# Patient Record
Sex: Male | Born: 1960 | Race: White | Hispanic: No | Marital: Married | Smoking: Former smoker
Health system: Southern US, Community
[De-identification: ages and names within clinical notes are randomized; demographics above are authoritative.]

## PROBLEM LIST (undated history)

## (undated) DIAGNOSIS — F32A Depression, unspecified: Secondary | ICD-10-CM

## (undated) DIAGNOSIS — Z87891 Personal history of nicotine dependence: Principal | ICD-10-CM

## (undated) DIAGNOSIS — F329 Major depressive disorder, single episode, unspecified: Secondary | ICD-10-CM

## (undated) DIAGNOSIS — M479 Spondylosis, unspecified: Secondary | ICD-10-CM

## (undated) HISTORY — DX: Personal history of nicotine dependence: Z87.891

## (undated) HISTORY — PX: COLONOSCOPY: SHX174

## (undated) HISTORY — PX: OTHER SURGICAL HISTORY: SHX169

---

## 2011-03-26 ENCOUNTER — Ambulatory Visit: Payer: Self-pay | Admitting: Internal Medicine

## 2011-03-26 LAB — COMPREHENSIVE METABOLIC PANEL
Anion Gap: 9 (ref 7–16)
Calcium, Total: 8.8 mg/dL (ref 8.5–10.1)
Chloride: 106 mmol/L (ref 98–107)
Co2: 27 mmol/L (ref 21–32)
EGFR (African American): >60
EGFR (Non-African Amer.): >60
Potassium: 4.1 mmol/L (ref 3.5–5.1)
SGOT(AST): 21 U/L (ref 15–37)

## 2011-03-26 LAB — HDL CHOLESTEROL: HDL Cholesterol: 38 mg/dL — ABNORMAL LOW (ref 40–60)

## 2011-08-29 ENCOUNTER — Ambulatory Visit: Payer: Self-pay | Admitting: Specialist

## 2012-08-20 ENCOUNTER — Ambulatory Visit: Payer: Self-pay | Admitting: Gastroenterology

## 2012-08-23 LAB — PATHOLOGY REPORT

## 2015-06-01 ENCOUNTER — Telehealth: Payer: Self-pay | Admitting: *Deleted

## 2015-06-01 NOTE — Telephone Encounter (Signed)
Left voicemail offering lung cancer screening appt Tuesday am 06/05/15. Will follow up.

## 2015-06-05 ENCOUNTER — Other Ambulatory Visit: Payer: Self-pay | Admitting: Family Medicine

## 2015-06-05 ENCOUNTER — Inpatient Hospital Stay: Payer: Commercial Managed Care - PPO | Attending: Family Medicine | Admitting: Family Medicine

## 2015-06-05 ENCOUNTER — Ambulatory Visit
Admission: RE | Admit: 2015-06-05 | Discharge: 2015-06-05 | Disposition: A | Discharge status: Home / Self Care | Payer: Commercial Managed Care - PPO | Source: Ambulatory Visit | Attending: Family Medicine | Admitting: Family Medicine

## 2015-06-05 ENCOUNTER — Encounter: Payer: Self-pay | Admitting: Family Medicine

## 2015-06-05 DIAGNOSIS — Z122 Encounter for screening for malignant neoplasm of respiratory organs: Secondary | ICD-10-CM

## 2015-06-05 DIAGNOSIS — Z87891 Personal history of nicotine dependence: Secondary | ICD-10-CM

## 2015-06-05 DIAGNOSIS — F17210 Nicotine dependence, cigarettes, uncomplicated: Secondary | ICD-10-CM | POA: Diagnosis not present

## 2015-06-05 HISTORY — DX: Personal history of nicotine dependence: Z87.891

## 2015-06-05 NOTE — Progress Notes (Signed)
In accordance with CMS guidelines, patient has meet eligibility criteria including age, absence of signs or symptoms of lung cancer, the specific calculation of cigarette smoking pack-years was 35 years and is a current smoker.   A shared decision-making session was conducted prior to the performance of CT scan. This includes one or more decision aids, includes benefits and harms of screening, follow-up diagnostic testing, over-diagnosis, false positive rate, and total radiation exposure.  Counseling on the importance of adherence to annual lung cancer LDCT screening, impact of co-morbidities, and ability or willingness to undergo diagnosis and treatment is imperative for compliance of the program.  Counseling on the importance of continued smoking cessation for former smokers; the importance of smoking cessation for current smokers and information about tobacco cessation interventions have been given to patient including the [place] at [facility], [address] quit [place], as well as Cancer Center specific smoking cessation programs.  Written order for lung cancer screening with LDCT has been given to the patient and any and all questions have been answered to the best of my abilities.   Yearly follow up will be scheduled by Shawn Perkins, Thoracic Navigator.   

## 2015-06-08 ENCOUNTER — Telehealth: Payer: Self-pay | Admitting: *Deleted

## 2015-06-08 NOTE — Telephone Encounter (Signed)
  Oncology Nurse Navigator Documentation    Navigator Encounter Type: Telephone;Screening (06/08/15 1400)                          Notified patient of LDCT lung cancer screening results of Lung Rads 1 finding with recommendation for 12 month follow up imaging. Patient verbalizes understanding.   IMPRESSION: 1. Lung-RADS Category 1, negative. Continue annual screening with low-dose chest CT without contrast in 12 months.

## 2016-01-18 ENCOUNTER — Emergency Department: Payer: Commercial Managed Care - PPO

## 2016-01-18 ENCOUNTER — Emergency Department
Admission: EM | Admit: 2016-01-18 | Discharge: 2016-01-18 | Disposition: A | Discharge status: Home / Self Care | Payer: Commercial Managed Care - PPO | Attending: Emergency Medicine | Admitting: Emergency Medicine

## 2016-01-18 ENCOUNTER — Encounter: Payer: Self-pay | Admitting: Emergency Medicine

## 2016-01-18 DIAGNOSIS — S60511A Abrasion of right hand, initial encounter: Secondary | ICD-10-CM | POA: Diagnosis not present

## 2016-01-18 DIAGNOSIS — F17210 Nicotine dependence, cigarettes, uncomplicated: Secondary | ICD-10-CM | POA: Insufficient documentation

## 2016-01-18 DIAGNOSIS — Y999 Unspecified external cause status: Secondary | ICD-10-CM | POA: Insufficient documentation

## 2016-01-18 DIAGNOSIS — Y939 Activity, unspecified: Secondary | ICD-10-CM | POA: Diagnosis not present

## 2016-01-18 DIAGNOSIS — S60211A Contusion of right wrist, initial encounter: Secondary | ICD-10-CM

## 2016-01-18 DIAGNOSIS — Y929 Unspecified place or not applicable: Secondary | ICD-10-CM | POA: Insufficient documentation

## 2016-01-18 DIAGNOSIS — S8001XA Contusion of right knee, initial encounter: Secondary | ICD-10-CM | POA: Diagnosis not present

## 2016-01-18 DIAGNOSIS — S0993XA Unspecified injury of face, initial encounter: Secondary | ICD-10-CM | POA: Diagnosis present

## 2016-01-18 DIAGNOSIS — S8002XA Contusion of left knee, initial encounter: Secondary | ICD-10-CM | POA: Diagnosis not present

## 2016-01-18 DIAGNOSIS — R51 Headache: Secondary | ICD-10-CM | POA: Insufficient documentation

## 2016-01-18 DIAGNOSIS — S0083XA Contusion of other part of head, initial encounter: Secondary | ICD-10-CM | POA: Insufficient documentation

## 2016-01-18 MED ORDER — METHOCARBAMOL 500 MG PO TABS: 500.0000 mg | ORAL_TABLET | Freq: Four times a day (QID) | ORAL | Status: DC

## 2016-01-18 MED ORDER — MELOXICAM 15 MG PO TABS: 15.0000 mg | ORAL_TABLET | Freq: Every day | ORAL | Status: DC
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
## 2016-05-15 ENCOUNTER — Telehealth: Payer: Self-pay | Admitting: *Deleted

## 2016-05-15 NOTE — Telephone Encounter (Signed)
Notified patient that annual lung cancer screening low dose CT scan is due. Confirmed that patient is within the age range of 55-77, and asymptomatic, (no signs or symptoms of lung cancer). Patient denies illness that would prevent curative treatment for lung cancer if found. The patient is a current smoker, with a 36 pack year history. The shared decision making visit was done 06/05/15. Patient is agreeable for CT scan being scheduled.

## 2016-05-28 ENCOUNTER — Other Ambulatory Visit: Payer: Self-pay | Admitting: *Deleted

## 2016-05-28 DIAGNOSIS — Z87891 Personal history of nicotine dependence: Secondary | ICD-10-CM

## 2016-06-04 ENCOUNTER — Ambulatory Visit
Admission: RE | Admit: 2016-06-04 | Discharge: 2016-06-04 | Disposition: A | Discharge status: Home / Self Care | Payer: Commercial Managed Care - PPO | Source: Ambulatory Visit | Attending: Oncology | Admitting: Oncology

## 2016-06-04 DIAGNOSIS — Z87891 Personal history of nicotine dependence: Secondary | ICD-10-CM | POA: Diagnosis not present

## 2016-06-19 ENCOUNTER — Telehealth: Payer: Self-pay | Admitting: *Deleted

## 2016-06-19 NOTE — Telephone Encounter (Signed)
Notified patient of LDCT lung cancer screening results with recommendation for 12 month follow up imaging. Patient verbalizes understanding. Copy of this note will be sent to PCP via Epic.   IMPRESSION: Lung-RADS Category 1, negative. Continue annual screening with low-dose chest CT without contrast in 12 months.

## 2017-05-26 ENCOUNTER — Telehealth: Payer: Self-pay | Admitting: *Deleted

## 2017-05-26 DIAGNOSIS — Z122 Encounter for screening for malignant neoplasm of respiratory organs: Secondary | ICD-10-CM

## 2017-05-26 DIAGNOSIS — Z87891 Personal history of nicotine dependence: Secondary | ICD-10-CM

## 2017-05-26 NOTE — Telephone Encounter (Signed)
Notified patient that annual lung cancer screening low dose CT scan is due currently or will be in near future. Confirmed that patient is within the age range of 55-77, and asymptomatic, (no signs or symptoms of lung cancer). Patient denies illness that would prevent curative treatment for lung cancer if found. Verified smoking history, (current, 37 pack year). The shared decision making visit was done 06/05/15. Patient is agreeable for CT scan being scheduled.

## 2017-06-05 ENCOUNTER — Ambulatory Visit
Admission: RE | Admit: 2017-06-05 | Discharge: 2017-06-05 | Disposition: A | Discharge status: Home / Self Care | Payer: Commercial Managed Care - PPO | Source: Ambulatory Visit | Attending: Oncology | Admitting: Oncology

## 2017-06-05 DIAGNOSIS — Z122 Encounter for screening for malignant neoplasm of respiratory organs: Secondary | ICD-10-CM | POA: Insufficient documentation

## 2017-06-05 DIAGNOSIS — Z87891 Personal history of nicotine dependence: Secondary | ICD-10-CM | POA: Diagnosis present

## 2017-06-05 DIAGNOSIS — J439 Emphysema, unspecified: Secondary | ICD-10-CM | POA: Insufficient documentation

## 2017-06-08 ENCOUNTER — Encounter: Payer: Self-pay | Admitting: *Deleted

## 2018-03-18 ENCOUNTER — Other Ambulatory Visit: Payer: Self-pay | Admitting: Orthopedic Surgery

## 2018-04-02 ENCOUNTER — Encounter (HOSPITAL_COMMUNITY): Payer: Self-pay

## 2018-04-02 NOTE — Patient Instructions (Signed)
Your procedure is scheduled on: Monday, Aug. 5, 2019   Surgery Time:  12noon-1:45PM   [facility]    Report to admitting at 9:30 AM   Call this number if you have problems the morning of surgery [phone]   Do not eat food or drink liquids :After Midnight.   Do NOT smoke after Midnight   Take these medicines the morning of surgery with A SIP OF WATER: Bupropion                               You may not have any metal on your body including  jewelry, and body piercings             Do not wear lotions, powders, perfumes/cologne, or deodorant                           Men may shave face and neck.   Do not bring valuables to the hospital. [place] IS NOT             RESPONSIBLE   FOR VALUABLES.   Contacts, dentures or bridgework may not be worn into surgery.   Leave suitcase in the car. After surgery it may be brought to your room.   Special Instructions: Bring a copy of your healthcare power of attorney and living will documents         the day of surgery if you haven't scanned them in before.              Please read over the following fact sheets you were given:  [facility] - Preparing for Surgery Before surgery, you can play an important role.  Because skin is not sterile, your skin needs to be as free of germs as possible.  You can reduce the number of germs on your skin by washing with CHG (chlorahexidine gluconate) soap before surgery.  CHG is an antiseptic cleaner which kills germs and bonds with the skin to continue killing germs even after washing. Please DO NOT use if you have an allergy to CHG or antibacterial soaps.  If your skin becomes reddened/irritated stop using the CHG and inform your nurse when you arrive at Short Stay. Do not shave (including legs and underarms) for at least 48 hours prior to the first CHG shower.  You may shave your face/neck.  Please follow these instructions carefully:  1.  Shower with CHG Soap  the night before surgery and the  morning of surgery.  2.  If you choose to wash your hair, wash your hair first as usual with your normal  shampoo.  3.  After you shampoo, rinse your hair and body thoroughly to remove the shampoo.                             4.  Use CHG as you would any other liquid soap.  You can apply chg directly to the skin and wash.  Gently with a scrungie or clean washcloth.  5.  Apply the CHG Soap to your body ONLY FROM THE NECK DOWN.   Do   not use on face/ open                           Wound or  open sores. Avoid contact with eyes, ears mouth and   genitals (private parts).                       Wash face,  Genitals (private parts) with your normal soap.             6.  Wash thoroughly, paying special attention to the area where your    surgery  will be performed.  7.  Thoroughly rinse your body with warm water from the neck down.  8.  DO NOT shower/wash with your normal soap after using and rinsing off the CHG Soap.                9.  Pat yourself dry with a clean towel.            10.  Wear clean pajamas.            11.  Place clean sheets on your bed the night of your first shower and do not  sleep with pets. Day of Surgery : Do not apply any lotions/deodorants the morning of surgery.  Please wear clean clothes to the hospital/surgery center.  FAILURE TO FOLLOW THESE INSTRUCTIONS MAY RESULT IN THE CANCELLATION OF YOUR SURGERY  PATIENT SIGNATURE_________________________________  NURSE SIGNATURE__________________________________  ________________________________________________________________________   Anthony Shelton  An incentive spirometer is a tool that can help keep your lungs clear and active. This tool measures how well you are filling your lungs with each breath. Taking long deep breaths may help reverse or decrease the chance of developing breathing (pulmonary) problems (especially infection) following:  A long period of time when you are unable  to move or be active. BEFORE THE PROCEDURE   If the spirometer includes an indicator to show your best effort, your nurse or respiratory therapist will set it to a desired goal.  If possible, sit up straight or lean slightly forward. Try not to slouch.  Hold the incentive spirometer in an upright position. INSTRUCTIONS FOR USE  1. Sit on the edge of your bed if possible, or sit up as far as you can in bed or on a chair. 2. Hold the incentive spirometer in an upright position. 3. Breathe out normally. 4. Place the mouthpiece in your mouth and seal your lips tightly around it. 5. Breathe in slowly and as deeply as possible, raising the piston or the ball toward the top of the column. 6. Hold your breath for 3-5 seconds or for as long as possible. Allow the piston or ball to fall to the bottom of the column. 7. Remove the mouthpiece from your mouth and breathe out normally. 8. Rest for a few seconds and repeat Steps 1 through 7 at least 10 times every 1-2 hours when you are awake. Take your time and take a few normal breaths between deep breaths. 9. The spirometer may include an indicator to show your best effort. Use the indicator as a goal to work toward during each repetition. 10. After each set of 10 deep breaths, practice coughing to be sure your lungs are clear. If you have an incision (the cut made at the time of surgery), support your incision when coughing by placing a pillow or rolled up towels firmly against it. Once you are able to get out of bed, walk around indoors and cough well. You may stop using the incentive spirometer when instructed by your caregiver.  RISKS AND COMPLICATIONS  Take your time so you  do not get dizzy or light-headed.  If you are in pain, you may need to take or ask for pain medication before doing incentive spirometry. It is harder to take a deep breath if you are having pain. AFTER USE  Rest and breathe slowly and easily.  It can be helpful to keep track  of a log of your progress. Your caregiver can provide you with a simple table to help with this. If you are using the spirometer at home, follow these instructions: Page IF:   You are having difficultly using the spirometer.  You have trouble using the spirometer as often as instructed.  Your pain medication is not giving enough relief while using the spirometer.  You develop fever of 100.5 F (38.1 C) or higher. SEEK IMMEDIATE MEDICAL CARE IF:   You cough up bloody sputum that had not been present before.  You develop fever of 102 F (38.9 C) or greater.  You develop worsening pain at or near the incision site. MAKE SURE YOU:   Understand these instructions.  Will watch your condition.  Will get help right away if you are not doing well or get worse. Document Released: 01/05/2007 Document Revised: 11/17/2011 Document Reviewed: 03/08/2007 ExitCare Patient Information 2014 Exit[employer], [place].   ________________________________________________________________________  WHAT IS A BLOOD TRANSFUSION? Blood Transfusion Information  A transfusion is the replacement of blood or some of its parts. Blood is made up of multiple cells which provide different functions.  Red blood cells carry oxygen and are used for blood loss replacement.  White blood cells fight against infection.  Platelets control bleeding.  Plasma helps clot blood.  Other blood products are available for specialized needs, such as hemophilia or other clotting disorders. BEFORE THE TRANSFUSION  Who gives blood for transfusions?   Healthy volunteers who are fully evaluated to make sure their blood is safe. This is blood bank blood. Transfusion therapy is the safest it has ever been in the practice of medicine. Before blood is taken from a donor, a complete history is taken to make sure that person has no history of diseases nor engages in risky social behavior (examples are intravenous drug use or  sexual activity with multiple partners). The donor's travel history is screened to minimize risk of transmitting infections, such as malaria. The donated blood is tested for signs of infectious diseases, such as HIV and hepatitis. The blood is then tested to be sure it is compatible with you in order to minimize the chance of a transfusion reaction. If you or a relative donates blood, this is often done in anticipation of surgery and is not appropriate for emergency situations. It takes many days to process the donated blood. RISKS AND COMPLICATIONS Although transfusion therapy is very safe and saves many lives, the main dangers of transfusion include:   Getting an infectious disease.  Developing a transfusion reaction. This is an allergic reaction to something in the blood you were given. Every precaution is taken to prevent this. The decision to have a blood transfusion has been considered carefully by your caregiver before blood is given. Blood is not given unless the benefits outweigh the risks. AFTER THE TRANSFUSION  Right after receiving a blood transfusion, you will usually feel much better and more energetic. This is especially true if your red blood cells have gotten low (anemic). The transfusion raises the level of the red blood cells which carry oxygen, and this usually causes an energy increase.  The nurse administering  the transfusion will monitor you carefully for complications. HOME CARE INSTRUCTIONS  No special instructions are needed after a transfusion. You may find your energy is better. Speak with your caregiver about any limitations on activity for underlying diseases you may have. SEEK MEDICAL CARE IF:   Your condition is not improving after your transfusion.  You develop redness or irritation at the intravenous (IV) site. SEEK IMMEDIATE MEDICAL CARE IF:  Any of the following symptoms occur over the next 12 hours:  Shaking chills.  You have a temperature by mouth above  102 F (38.9 C), not controlled by medicine.  Chest, back, or muscle pain.  People around you feel you are not acting correctly or are confused.  Shortness of breath or difficulty breathing.  Dizziness and fainting.  You get a rash or develop hives.  You have a decrease in urine output.  Your urine turns a dark color or changes to pink, red, or brown. Any of the following symptoms occur over the next 10 days:  You have a temperature by mouth above 102 F (38.9 C), not controlled by medicine.  Shortness of breath.  Weakness after normal activity.  The white part of the eye turns yellow (jaundice).  You have a decrease in the amount of urine or are urinating less often.  Your urine turns a dark color or changes to pink, red, or brown. Document Released: 08/22/2000 Document Revised: 11/17/2011 Document Reviewed: 04/10/2008 [facility] Patient Information 2014 [place], [place].  _______________________________________________________________________

## 2018-04-05 ENCOUNTER — Encounter (HOSPITAL_COMMUNITY)
Admission: RE | Admit: 2018-04-05 | Discharge: 2018-04-05 | Disposition: A | Discharge status: Home / Self Care | Payer: Commercial Managed Care - PPO | Source: Ambulatory Visit | Attending: Orthopedic Surgery | Admitting: Orthopedic Surgery

## 2018-04-05 ENCOUNTER — Ambulatory Visit (HOSPITAL_COMMUNITY)
Admission: RE | Admit: 2018-04-05 | Discharge: 2018-04-05 | Disposition: A | Discharge status: Home / Self Care | Payer: Commercial Managed Care - PPO | Source: Ambulatory Visit | Attending: Orthopedic Surgery | Admitting: Orthopedic Surgery

## 2018-04-05 ENCOUNTER — Other Ambulatory Visit: Payer: Self-pay

## 2018-04-05 ENCOUNTER — Encounter (HOSPITAL_COMMUNITY): Payer: Self-pay

## 2018-04-05 DIAGNOSIS — Z01818 Encounter for other preprocedural examination: Secondary | ICD-10-CM | POA: Insufficient documentation

## 2018-04-05 DIAGNOSIS — Z01812 Encounter for preprocedural laboratory examination: Secondary | ICD-10-CM | POA: Diagnosis present

## 2018-04-05 DIAGNOSIS — M1712 Unilateral primary osteoarthritis, left knee: Secondary | ICD-10-CM | POA: Insufficient documentation

## 2018-04-05 DIAGNOSIS — Z01810 Encounter for preprocedural cardiovascular examination: Secondary | ICD-10-CM | POA: Insufficient documentation

## 2018-04-05 HISTORY — DX: Major depressive disorder, single episode, unspecified: F32.9

## 2018-04-05 HISTORY — DX: Spondylosis, unspecified: M47.9

## 2018-04-05 HISTORY — DX: Depression, unspecified: F32.A

## 2018-04-05 LAB — CBC WITH DIFFERENTIAL/PLATELET
BASOS ABS: 0.1 10*3/uL (ref 0.0–0.1)
BASOS PCT: 1 %
EOS PCT: 1 %
Eosinophils Absolute: 0.1 10*3/uL (ref 0.0–0.7)
HCT: 42.1 % (ref 39.0–52.0)
Hemoglobin: 14.6 g/dL (ref 13.0–17.0)
LYMPHS PCT: 28 %
Lymphs Abs: 2 10*3/uL (ref 0.7–4.0)
MCH: 29.5 pg (ref 26.0–34.0)
MCHC: 34.7 g/dL (ref 30.0–36.0)
MCV: 85.1 fL (ref 78.0–100.0)
Monocytes Absolute: 0.4 10*3/uL (ref 0.1–1.0)
Monocytes Relative: 6 %
Neutro Abs: 4.6 10*3/uL (ref 1.7–7.7)
Neutrophils Relative %: 64 %
PLATELETS: 264 10*3/uL (ref 150–400)
RBC: 4.95 MIL/uL (ref 4.22–5.81)
RDW: 12.3 % (ref 11.5–15.5)
WBC: 7.1 10*3/uL (ref 4.0–10.5)

## 2018-04-05 LAB — URINALYSIS, ROUTINE W REFLEX MICROSCOPIC
Bilirubin Urine: NEGATIVE
Glucose, UA: NEGATIVE mg/dL
HGB URINE DIPSTICK: NEGATIVE
Ketones, ur: NEGATIVE mg/dL
LEUKOCYTES UA: NEGATIVE
NITRITE: NEGATIVE
Protein, ur: NEGATIVE mg/dL
SPECIFIC GRAVITY, URINE: 1.006 (ref 1.005–1.030)
pH: 6 (ref 5.0–8.0)

## 2018-04-05 LAB — BASIC METABOLIC PANEL
ANION GAP: 7 (ref 5–15)
BUN: 19 mg/dL (ref 6–20)
CALCIUM: 9.4 mg/dL (ref 8.9–10.3)
CO2: 28 mmol/L (ref 22–32)
Chloride: 106 mmol/L (ref 98–111)
Creatinine, Ser: 0.93 mg/dL (ref 0.61–1.24)
GFR calc Af Amer: >60 mL/min (ref >60)
Glucose, Bld: 106 mg/dL — ABNORMAL HIGH (ref 70–99)
POTASSIUM: 4.4 mmol/L (ref 3.5–5.1)
SODIUM: 141 mmol/L (ref 135–145)

## 2018-04-05 LAB — PROTIME-INR
INR: 0.99
PROTHROMBIN TIME: 13 s (ref 11.4–15.2)

## 2018-04-05 LAB — SURGICAL PCR SCREEN
MRSA, PCR: NEGATIVE
STAPHYLOCOCCUS AUREUS: POSITIVE — AB

## 2018-04-05 LAB — APTT: APTT: 29 s (ref 24–36)

## 2018-04-05 LAB — ABO/RH: ABO/RH(D): A POS
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
## 2018-04-08 DIAGNOSIS — M1712 Unilateral primary osteoarthritis, left knee: Secondary | ICD-10-CM | POA: Diagnosis present

## 2018-04-08 NOTE — H&P (Signed)
TOTAL KNEE ADMISSION H&P  Patient is being admitted for left total knee arthroplasty.  Subjective:  Chief Complaint:left knee pain.  HPI: Anthony Shelton, 57 y.o. male, has a history of pain and functional disability in the left knee due to arthritis and has failed non-surgical conservative treatments for greater than 12 weeks to includeNSAID's and/or analgesics, corticosteriod injections and activity modification.  Onset of symptoms was gradual, starting 1 years ago with gradually worsening course since that time. The patient noted no past surgery on the left knee(s).  Patient currently rates pain in the left knee(s) at 10 out of 10 with activity. Patient has night pain, worsening of pain with activity and weight bearing, pain that interferes with activities of daily living and pain with passive range of motion.  Patient has evidence of periarticular osteophytes, joint subluxation and joint space narrowing by imaging studies. There is no active infection.  Patient Active Problem List   Diagnosis Date Noted  . Personal history of tobacco use, presenting hazards to health 06/05/2015   Past Medical History:  Diagnosis Date  . Depression   . Personal history of tobacco use, presenting hazards to health 06/05/2015  . Spondylosis    Mild, cervical    Past Surgical History:  Procedure Laterality Date  . COLONOSCOPY    . dislocated elbow      No current facility-administered medications for this encounter.    Current Outpatient Medications  Medication Sig Dispense Refill Last Dose  . buPROPion (WELLBUTRIN XL) 150 MG 24 hr tablet Take 150 mg by mouth daily.     Marland Kitchen. etodolac (LODINE) 500 MG tablet Take 500 mg by mouth 2 (two) times daily.     . Multiple Vitamins-Minerals (PRESERVISION AREDS 2 PO) Take 1 tablet by mouth 2 (two) times daily.     Marland Kitchen. amoxicillin (AMOXIL) 500 MG tablet Take 500 mg by mouth 4 (four) times daily.      No Known Allergies  Social History   Tobacco Use  . Smoking  status: Former Smoker    Packs/day: 1.00    Years: 35.00    Pack years: 35.00    Types: Cigarettes    Last attempt to quit: 03/15/2018    Years since quitting: 0.0  . Smokeless tobacco: Never Used  Substance Use Topics  . Alcohol use: No    Alcohol/week: 0.0 oz    No family history on file.   Review of Systems  Musculoskeletal: Positive for joint pain.  All other systems reviewed and are negative.   Objective:  Physical Exam  Constitutional: He is oriented to person, place, and time. He appears well-developed and well-nourished.  HENT:  Head: Normocephalic and atraumatic.  Eyes: Pupils are equal, round, and reactive to light.  Neck: Normal range of motion. Neck supple.  Cardiovascular: Intact distal pulses.  Respiratory: Effort normal.  Musculoskeletal: He exhibits tenderness and deformity.  Both knees, actually come to full extension 5-10 varus deformities, left greater than right, flexion is 125.  Collateral ligaments are stable.  He is neurovascularly intact distally.  Normal pulses to his feet.  Toes are pink and well-perfused.  Neurological: He is alert and oriented to person, place, and time.  Skin: Skin is warm and dry.  Psychiatric: He has a normal mood and affect. His behavior is normal. Judgment and thought content normal.    Vital signs in last 24 hours:    Labs:   Estimated body mass index is 31.88 kg/m as calculated from the following:  Height as of 04/05/18: 5\' 7"  (1.702 m).   Weight as of 04/05/18: 92.3 kg (203 lb 9 oz).   Imaging Review Plain radiographs demonstrate AP, Rosenberg, lateral and sunrise x-rays show medial compartment bone-on-bone arthritis with erosion of the tibial plateau, left greater than right lateral subluxation of the tibia greater than 5 mm on each side.  Peripheral osteophytes.  Moderate arthritis of the patellofemoral joint and lateral compartment.    Preoperative templating of the joint replacement has been completed,  documented, and submitted to the Operating Room personnel in order to optimize intra-operative equipment management.   Anticipated LOS equal to or greater than 2 midnights due to - Age 5 and older with one or more of the following:  - Obesity  - Expected need for hospital services (PT, OT, Nursing) required for safe  discharge  - Anticipated need for postoperative skilled nursing care or inpatient rehab  - Active co-morbidities: tobacco use      Assessment/Plan:  End stage arthritis, left knee   The patient history, physical examination, clinical judgment of the provider and imaging studies are consistent with end stage degenerative joint disease of the left knee(s) and total knee arthroplasty is deemed medically necessary. The treatment options including medical management, injection therapy arthroscopy and arthroplasty were discussed at length. The risks and benefits of total knee arthroplasty were presented and reviewed. The risks due to aseptic loosening, infection, stiffness, patella tracking problems, thromboembolic complications and other imponderables were discussed. The patient acknowledged the explanation, agreed to proceed with the plan and consent was signed. Patient is being admitted for inpatient treatment for surgery, pain control, PT, OT, prophylactic antibiotics, VTE prophylaxis, progressive ambulation and ADL's and discharge planning. The patient is planning to be discharged home with home health services

## 2018-04-11 MED ORDER — TRANEXAMIC ACID 1000 MG/10ML IV SOLN
2000.0000 mg | INTRAVENOUS | Status: DC
  Filled 2018-04-11: qty 20

## 2018-04-12 ENCOUNTER — Other Ambulatory Visit: Payer: Self-pay

## 2018-04-12 ENCOUNTER — Encounter (HOSPITAL_COMMUNITY)
Admission: RE | Disposition: A | Discharge status: Home / Self Care | Payer: Self-pay | Source: Ambulatory Visit | Attending: Orthopedic Surgery

## 2018-04-12 ENCOUNTER — Ambulatory Visit (HOSPITAL_COMMUNITY): Payer: Commercial Managed Care - PPO | Admitting: Anesthesiology

## 2018-04-12 ENCOUNTER — Encounter (HOSPITAL_COMMUNITY): Payer: Self-pay | Admitting: Anesthesiology

## 2018-04-12 ENCOUNTER — Observation Stay (HOSPITAL_COMMUNITY)
Admission: RE | Admit: 2018-04-12 | Discharge: 2018-04-13 | Disposition: A | Discharge status: Home / Self Care | Payer: Commercial Managed Care - PPO | Source: Ambulatory Visit | Attending: Orthopedic Surgery | Admitting: Orthopedic Surgery

## 2018-04-12 DIAGNOSIS — M1712 Unilateral primary osteoarthritis, left knee: Secondary | ICD-10-CM | POA: Diagnosis not present

## 2018-04-12 DIAGNOSIS — Z87891 Personal history of nicotine dependence: Secondary | ICD-10-CM | POA: Diagnosis not present

## 2018-04-12 DIAGNOSIS — F329 Major depressive disorder, single episode, unspecified: Secondary | ICD-10-CM | POA: Insufficient documentation

## 2018-04-12 DIAGNOSIS — M479 Spondylosis, unspecified: Secondary | ICD-10-CM | POA: Diagnosis not present

## 2018-04-12 DIAGNOSIS — Z79899 Other long term (current) drug therapy: Secondary | ICD-10-CM | POA: Insufficient documentation

## 2018-04-12 HISTORY — PX: TOTAL KNEE ARTHROPLASTY: SHX125

## 2018-04-12 LAB — TYPE AND SCREEN
ABO/RH(D): A POS
Antibody Screen: NEGATIVE

## 2018-04-12 SURGERY — ARTHROPLASTY, KNEE, TOTAL
Anesthesia: Spinal | Site: Knee | Laterality: Left

## 2018-04-12 MED ORDER — PROPOFOL 10 MG/ML IV BOLUS
INTRAVENOUS | Status: AC
  Filled 2018-04-12: qty 20

## 2018-04-12 MED ORDER — SODIUM CHLORIDE 0.9 % IJ SOLN
INTRAMUSCULAR | Status: DC | PRN
  Administered 2018-04-12: 50 mL

## 2018-04-12 MED ORDER — PANTOPRAZOLE SODIUM 40 MG PO TBEC
40.0000 mg | DELAYED_RELEASE_TABLET | Freq: Every day | ORAL | Status: DC
  Administered 2018-04-12 – 2018-04-13 (×2): 40 mg via ORAL
  Filled 2018-04-12 (×2): qty 1

## 2018-04-12 MED ORDER — FENTANYL CITRATE (PF) 100 MCG/2ML IJ SOLN
100.0000 ug | Freq: Once | INTRAMUSCULAR | Status: AC
  Administered 2018-04-12: 50 ug via INTRAVENOUS
  Filled 2018-04-12: qty 2

## 2018-04-12 MED ORDER — BUPIVACAINE LIPOSOME 1.3 % IJ SUSP
20.0000 mL | Freq: Once | INTRAMUSCULAR | Status: DC
  Filled 2018-04-12: qty 20

## 2018-04-12 MED ORDER — SODIUM CHLORIDE 0.9 % IV SOLN: INTRAVENOUS | Status: DC | PRN

## 2018-04-12 MED ORDER — DOCUSATE SODIUM 100 MG PO CAPS
100.0000 mg | ORAL_CAPSULE | Freq: Two times a day (BID) | ORAL | Status: DC
  Administered 2018-04-12 – 2018-04-13 (×2): 100 mg via ORAL
  Filled 2018-04-12 (×2): qty 1

## 2018-04-12 MED ORDER — WATER FOR IRRIGATION, STERILE IR SOLN
Status: DC | PRN
  Administered 2018-04-12: 2000 mL

## 2018-04-12 MED ORDER — OXYCODONE-ACETAMINOPHEN 5-325 MG PO TABS: 1.0000 | ORAL_TABLET | ORAL | 0 refills | Status: DC | PRN

## 2018-04-12 MED ORDER — ONDANSETRON HCL 4 MG/2ML IJ SOLN
4.0000 mg | Freq: Four times a day (QID) | INTRAMUSCULAR | Status: DC | PRN
Start: 2018-04-12 — End: 2018-04-13

## 2018-04-12 MED ORDER — OXYCODONE HCL 5 MG PO TABS
5.0000 mg | ORAL_TABLET | ORAL | Status: DC | PRN
  Administered 2018-04-12 – 2018-04-13 (×3): 10 mg via ORAL
  Administered 2018-04-13: 5 mg via ORAL
  Filled 2018-04-12 (×4): qty 2

## 2018-04-12 MED ORDER — METHOCARBAMOL 500 MG IVPB - SIMPLE MED
INTRAVENOUS | Status: AC
  Filled 2018-04-12: qty 50

## 2018-04-12 MED ORDER — METOCLOPRAMIDE HCL 5 MG/ML IJ SOLN: 5.0000 mg | Freq: Three times a day (TID) | INTRAMUSCULAR | Status: DC | PRN

## 2018-04-12 MED ORDER — LIDOCAINE 2% (20 MG/ML) 5 ML SYRINGE
INTRAMUSCULAR | Status: AC
  Filled 2018-04-12: qty 5

## 2018-04-12 MED ORDER — EPHEDRINE SULFATE-NACL 50-0.9 MG/10ML-% IV SOSY
PREFILLED_SYRINGE | INTRAVENOUS | Status: DC | PRN
  Administered 2018-04-12: 10 mg via INTRAVENOUS

## 2018-04-12 MED ORDER — MENTHOL 3 MG MT LOZG: 1.0000 | LOZENGE | OROMUCOSAL | Status: DC | PRN

## 2018-04-12 MED ORDER — DEXAMETHASONE SODIUM PHOSPHATE 10 MG/ML IJ SOLN
INTRAMUSCULAR | Status: DC | PRN
  Administered 2018-04-12: 10 mg via INTRAVENOUS

## 2018-04-12 MED ORDER — BUPIVACAINE LIPOSOME 1.3 % IJ SUSP
INTRAMUSCULAR | Status: DC | PRN
  Administered 2018-04-12: 20 mL

## 2018-04-12 MED ORDER — LACTATED RINGERS IV SOLN
INTRAVENOUS | Status: DC
  Administered 2018-04-12 (×2): via INTRAVENOUS

## 2018-04-12 MED ORDER — POLYETHYLENE GLYCOL 3350 17 G PO PACK: 17.0000 g | PACK | Freq: Every day | ORAL | Status: DC | PRN

## 2018-04-12 MED ORDER — BUPIVACAINE-EPINEPHRINE (PF) 0.5% -1:200000 IJ SOLN
INTRAMUSCULAR | Status: AC
  Filled 2018-04-12: qty 30

## 2018-04-12 MED ORDER — ONDANSETRON HCL 4 MG/2ML IJ SOLN
INTRAMUSCULAR | Status: AC
  Filled 2018-04-12: qty 2

## 2018-04-12 MED ORDER — DEXAMETHASONE SODIUM PHOSPHATE 10 MG/ML IJ SOLN
INTRAMUSCULAR | Status: AC
  Filled 2018-04-12: qty 1

## 2018-04-12 MED ORDER — BUPIVACAINE-EPINEPHRINE (PF) 0.5% -1:200000 IJ SOLN
INTRAMUSCULAR | Status: DC | PRN
  Administered 2018-04-12: 20 mL

## 2018-04-12 MED ORDER — TRANEXAMIC ACID 1000 MG/10ML IV SOLN
INTRAVENOUS | Status: DC | PRN
  Administered 2018-04-12: 2000 mg via TOPICAL

## 2018-04-12 MED ORDER — LIDOCAINE HCL (CARDIAC) PF 100 MG/5ML IV SOSY
PREFILLED_SYRINGE | INTRAVENOUS | Status: DC | PRN
  Administered 2018-04-12: 80 mg via INTRAVENOUS

## 2018-04-12 MED ORDER — PHENOL 1.4 % MT LIQD: 1.0000 | OROMUCOSAL | Status: DC | PRN

## 2018-04-12 MED ORDER — SODIUM CHLORIDE 0.9 % IR SOLN
Status: DC | PRN
  Administered 2018-04-12: 1000 mL

## 2018-04-12 MED ORDER — LACTATED RINGERS IV SOLN: INTRAVENOUS | Status: DC

## 2018-04-12 MED ORDER — BISACODYL 5 MG PO TBEC: 5.0000 mg | DELAYED_RELEASE_TABLET | Freq: Every day | ORAL | Status: DC | PRN

## 2018-04-12 MED ORDER — FLEET ENEMA 7-19 GM/118ML RE ENEM: 1.0000 | ENEMA | Freq: Once | RECTAL | Status: DC | PRN

## 2018-04-12 MED ORDER — PROPOFOL 10 MG/ML IV BOLUS
INTRAVENOUS | Status: AC
Start: 2018-04-12 — End: ?
  Filled 2018-04-12: qty 60

## 2018-04-12 MED ORDER — MIDAZOLAM HCL 2 MG/2ML IJ SOLN
2.0000 mg | Freq: Once | INTRAMUSCULAR | Status: AC
  Administered 2018-04-12: 2 mg via INTRAVENOUS
  Filled 2018-04-12: qty 2

## 2018-04-12 MED ORDER — ASPIRIN 81 MG PO CHEW
81.0000 mg | CHEWABLE_TABLET | Freq: Two times a day (BID) | ORAL | Status: DC
  Administered 2018-04-12 – 2018-04-13 (×2): 81 mg via ORAL
  Filled 2018-04-12 (×2): qty 1

## 2018-04-12 MED ORDER — CHLORHEXIDINE GLUCONATE 4 % EX LIQD: 60.0000 mL | Freq: Once | CUTANEOUS | Status: DC

## 2018-04-12 MED ORDER — CEFAZOLIN SODIUM-DEXTROSE 2-4 GM/100ML-% IV SOLN
2.0000 g | INTRAVENOUS | Status: AC
  Administered 2018-04-12: 2 g via INTRAVENOUS
  Filled 2018-04-12: qty 100

## 2018-04-12 MED ORDER — PROMETHAZINE HCL 25 MG/ML IJ SOLN: 6.2500 mg | INTRAMUSCULAR | Status: DC | PRN

## 2018-04-12 MED ORDER — ROPIVACAINE HCL 7.5 MG/ML IJ SOLN
INTRAMUSCULAR | Status: DC | PRN
  Administered 2018-04-12: 20 mL via PERINEURAL

## 2018-04-12 MED ORDER — TRANEXAMIC ACID 1000 MG/10ML IV SOLN
1000.0000 mg | Freq: Once | INTRAVENOUS | Status: AC
  Administered 2018-04-12: 1000 mg via INTRAVENOUS
  Filled 2018-04-12: qty 1000

## 2018-04-12 MED ORDER — BUPROPION HCL ER (XL) 150 MG PO TB24
150.0000 mg | ORAL_TABLET | Freq: Every day | ORAL | Status: DC
  Administered 2018-04-13: 150 mg via ORAL
  Filled 2018-04-12: qty 1

## 2018-04-12 MED ORDER — BUPIVACAINE LIPOSOME 1.3 % IJ SUSP: 20.0000 mL | Freq: Once | INTRAMUSCULAR | Status: DC

## 2018-04-12 MED ORDER — PROPOFOL 10 MG/ML IV BOLUS
INTRAVENOUS | Status: DC | PRN
  Administered 2018-04-12: 40 mg via INTRAVENOUS

## 2018-04-12 MED ORDER — METHOCARBAMOL 500 MG PO TABS: 500.0000 mg | ORAL_TABLET | Freq: Two times a day (BID) | ORAL | 0 refills | Status: DC

## 2018-04-12 MED ORDER — PROPOFOL 500 MG/50ML IV EMUL
INTRAVENOUS | Status: DC | PRN
  Administered 2018-04-12: 100 ug/kg/min via INTRAVENOUS

## 2018-04-12 MED ORDER — TRANEXAMIC ACID 1000 MG/10ML IV SOLN: 1000.0000 mg | INTRAVENOUS | Status: DC

## 2018-04-12 MED ORDER — METHOCARBAMOL 500 MG PO TABS
500.0000 mg | ORAL_TABLET | Freq: Four times a day (QID) | ORAL | Status: DC | PRN
  Administered 2018-04-12 – 2018-04-13 (×2): 500 mg via ORAL
  Filled 2018-04-12 (×2): qty 1

## 2018-04-12 MED ORDER — AMOXICILLIN 500 MG PO TABS: 500.0000 mg | ORAL_TABLET | Freq: Four times a day (QID) | ORAL | Status: DC

## 2018-04-12 MED ORDER — ACETAMINOPHEN 325 MG PO TABS
325.0000 mg | ORAL_TABLET | Freq: Four times a day (QID) | ORAL | Status: DC | PRN
  Administered 2018-04-13: 650 mg via ORAL
  Filled 2018-04-12: qty 2

## 2018-04-12 MED ORDER — PHENYLEPHRINE 40 MCG/ML (10ML) SYRINGE FOR IV PUSH (FOR BLOOD PRESSURE SUPPORT)
PREFILLED_SYRINGE | INTRAVENOUS | Status: DC | PRN
  Administered 2018-04-12 (×2): 80 ug via INTRAVENOUS

## 2018-04-12 MED ORDER — ALUM & MAG HYDROXIDE-SIMETH 200-200-20 MG/5ML PO SUSP: 30.0000 mL | ORAL | Status: DC | PRN

## 2018-04-12 MED ORDER — MEPERIDINE HCL 50 MG/ML IJ SOLN: 6.2500 mg | INTRAMUSCULAR | Status: DC | PRN

## 2018-04-12 MED ORDER — ONDANSETRON HCL 4 MG/2ML IJ SOLN
INTRAMUSCULAR | Status: DC | PRN
  Administered 2018-04-12: 4 mg via INTRAVENOUS

## 2018-04-12 MED ORDER — METOCLOPRAMIDE HCL 5 MG PO TABS: 5.0000 mg | ORAL_TABLET | Freq: Three times a day (TID) | ORAL | Status: DC | PRN

## 2018-04-12 MED ORDER — SODIUM CHLORIDE 0.9 % IJ SOLN
INTRAMUSCULAR | Status: AC
  Filled 2018-04-12: qty 50

## 2018-04-12 MED ORDER — GABAPENTIN 300 MG PO CAPS
300.0000 mg | ORAL_CAPSULE | Freq: Three times a day (TID) | ORAL | Status: DC
  Administered 2018-04-12 – 2018-04-13 (×3): 300 mg via ORAL
  Filled 2018-04-12 (×3): qty 1

## 2018-04-12 MED ORDER — KCL IN DEXTROSE-NACL 20-5-0.45 MEQ/L-%-% IV SOLN
INTRAVENOUS | Status: DC
  Administered 2018-04-12 – 2018-04-13 (×3): via INTRAVENOUS
  Filled 2018-04-12 (×2): qty 1000

## 2018-04-12 MED ORDER — ASPIRIN EC 81 MG PO TBEC: 81.0000 mg | DELAYED_RELEASE_TABLET | Freq: Two times a day (BID) | ORAL | 0 refills | Status: DC

## 2018-04-12 MED ORDER — DEXAMETHASONE SODIUM PHOSPHATE 10 MG/ML IJ SOLN
10.0000 mg | Freq: Once | INTRAMUSCULAR | Status: AC
  Administered 2018-04-13: 10 mg via INTRAVENOUS
  Filled 2018-04-12: qty 1

## 2018-04-12 MED ORDER — METHOCARBAMOL 500 MG IVPB - SIMPLE MED
500.0000 mg | Freq: Four times a day (QID) | INTRAVENOUS | Status: DC | PRN
  Administered 2018-04-12: 500 mg via INTRAVENOUS
  Filled 2018-04-12: qty 50

## 2018-04-12 MED ORDER — DIPHENHYDRAMINE HCL 12.5 MG/5ML PO ELIX: 12.5000 mg | ORAL_SOLUTION | ORAL | Status: DC | PRN

## 2018-04-12 MED ORDER — TRANEXAMIC ACID 1000 MG/10ML IV SOLN
1000.0000 mg | INTRAVENOUS | Status: AC
  Administered 2018-04-12: 1000 mg via INTRAVENOUS
  Filled 2018-04-12: qty 10

## 2018-04-12 MED ORDER — ONDANSETRON HCL 4 MG PO TABS: 4.0000 mg | ORAL_TABLET | Freq: Four times a day (QID) | ORAL | Status: DC | PRN

## 2018-04-12 MED ORDER — HYDROMORPHONE HCL 1 MG/ML IJ SOLN: 0.2500 mg | INTRAMUSCULAR | Status: DC | PRN

## 2018-04-12 MED ORDER — HYDROMORPHONE HCL 1 MG/ML IJ SOLN: 0.5000 mg | INTRAMUSCULAR | Status: DC | PRN

## 2018-04-12 SURGICAL SUPPLY — 54 items
ATTUNE MED DOME PAT 38 KNEE (Knees) ×2 IMPLANT
ATTUNE PS FEM LT SZ 8 CEM KNEE (Femur) ×2 IMPLANT
ATTUNE PSRP INSR SZ8 5 KNEE (Insert) ×2 IMPLANT
BAG DECANTER FOR FLEXI CONT (MISCELLANEOUS) ×2 IMPLANT
BAG ZIPLOCK 12X15 (MISCELLANEOUS) ×2 IMPLANT
BASE TIBIAL ROT PLAT SZ 8 KNEE (Knees) ×1 IMPLANT
BLADE SAG 18X100X1.27 (BLADE) ×2 IMPLANT
BLADE SAW SGTL 13.0X1.19X90.0M (BLADE) ×2 IMPLANT
BLADE SAW SGTL 13X75X1.27 (BLADE) ×2 IMPLANT
BNDG ELASTIC 6X10 VLCR STRL LF (GAUZE/BANDAGES/DRESSINGS) ×2 IMPLANT
BOWL SMART MIX CTS (DISPOSABLE) ×2 IMPLANT
CEMENT HV SMART SET (Cement) ×4 IMPLANT
COVER SURGICAL LIGHT HANDLE (MISCELLANEOUS) ×2 IMPLANT
CUFF TOURN SGL QUICK 34 (TOURNIQUET CUFF) ×1
CUFF TRNQT CYL 34X4X40X1 (TOURNIQUET CUFF) ×1 IMPLANT
DECANTER SPIKE VIAL GLASS SM (MISCELLANEOUS) ×2 IMPLANT
DRAPE ORTHO SPLIT 77X108 STRL (DRAPES) ×1
DRAPE SURG ORHT 6 SPLT 77X108 (DRAPES) ×1 IMPLANT
DRAPE U-SHAPE 47X51 STRL (DRAPES) ×2 IMPLANT
DRSG AQUACEL AG ADV 3.5X10 (GAUZE/BANDAGES/DRESSINGS) ×2 IMPLANT
DURAPREP 26ML APPLICATOR (WOUND CARE) ×2 IMPLANT
ELECT REM PT RETURN 15FT ADLT (MISCELLANEOUS) ×2 IMPLANT
GLOVE BIO SURGEON STRL SZ7.5 (GLOVE) ×2 IMPLANT
GLOVE BIO SURGEON STRL SZ8.5 (GLOVE) ×2 IMPLANT
GLOVE BIOGEL PI IND STRL 7.0 (GLOVE) ×3 IMPLANT
GLOVE BIOGEL PI IND STRL 7.5 (GLOVE) ×2 IMPLANT
GLOVE BIOGEL PI IND STRL 8 (GLOVE) ×1 IMPLANT
GLOVE BIOGEL PI IND STRL 9 (GLOVE) ×1 IMPLANT
GLOVE BIOGEL PI INDICATOR 7.0 (GLOVE) ×3
GLOVE BIOGEL PI INDICATOR 7.5 (GLOVE) ×2
GLOVE BIOGEL PI INDICATOR 8 (GLOVE) ×1
GLOVE BIOGEL PI INDICATOR 9 (GLOVE) ×1
GOWN STRL REUS W/TWL XL LVL3 (GOWN DISPOSABLE) ×8 IMPLANT
HANDPIECE INTERPULSE COAX TIP (DISPOSABLE) ×1
HOLDER FOLEY CATH W/STRAP (MISCELLANEOUS) ×2 IMPLANT
HOOD PEEL AWAY FLYTE STAYCOOL (MISCELLANEOUS) ×6 IMPLANT
NEEDLE HYPO 22GX1.5 SAFETY (NEEDLE) ×2 IMPLANT
NS IRRIG 1000ML POUR BTL (IV SOLUTION) ×2 IMPLANT
PACK ICE MAXI GEL EZY WRAP (MISCELLANEOUS) ×2 IMPLANT
PACK TOTAL KNEE CUSTOM (KITS) ×2 IMPLANT
POSITIONER SURGICAL ARM (MISCELLANEOUS) ×2 IMPLANT
SET HNDPC FAN SPRY TIP SCT (DISPOSABLE) ×1 IMPLANT
STAPLER VISISTAT 35W (STAPLE) IMPLANT
SUT VIC AB 1 CTX 36 (SUTURE) ×1
SUT VIC AB 1 CTX36XBRD ANBCTR (SUTURE) ×1 IMPLANT
SUT VIC AB 2-0 CT1 27 (SUTURE) ×1
SUT VIC AB 2-0 CT1 TAPERPNT 27 (SUTURE) ×1 IMPLANT
SUT VIC AB 3-0 CT1 27 (SUTURE) ×1
SUT VIC AB 3-0 CT1 TAPERPNT 27 (SUTURE) ×1 IMPLANT
SYR CONTROL 10ML LL (SYRINGE) ×4 IMPLANT
TIBIAL BASE ROT PLAT SZ 8 KNEE (Knees) ×2 IMPLANT
TRAY FOLEY CATH 16FRSI W/METER (SET/KITS/TRAYS/PACK) ×2 IMPLANT
WATER STERILE IRR 1000ML POUR (IV SOLUTION) ×4 IMPLANT
YANKAUER SUCT BULB TIP 10FT TU (MISCELLANEOUS) ×2 IMPLANT

## 2018-04-12 NOTE — Progress Notes (Signed)
Assisted Dr. Hollis with left, ultrasound guided, adductor canal block. Side rails up, monitors on throughout procedure. See vital signs in flow sheet. Tolerated Procedure well.  

## 2018-04-12 NOTE — Transfer of Care (Signed)
Immediate Anesthesia Transfer of Care Note  Patient: Anthony Shelton  Procedure(s) Performed: LEFT TOTAL KNEE ARTHROPLASTY (Left Knee)  Patient Location: PACU  Anesthesia Type:Spinal  Level of Consciousness: awake, alert , oriented and patient cooperative  Airway & Oxygen Therapy: Patient Spontanous Breathing and Patient connected to face mask oxygen  Post-op Assessment: Report given to RN and Post -op Vital signs reviewed and stable  Post vital signs: Reviewed and stable  Last Vitals:  Vitals Value Taken Time  BP    Temp    Pulse 71 04/12/2018  2:56 PM  Resp 19 04/12/2018  2:56 PM  SpO2 100 % 04/12/2018  2:56 PM  Vitals shown include unvalidated device data.  Last Pain:  Vitals:   04/12/18 0955  TempSrc:   PainSc: 0-No pain      Patients Stated Pain Goal: 4 (04/12/18 0955)  Complications: No apparent anesthesia complications

## 2018-04-12 NOTE — Interval H&P Note (Signed)
History and Physical Interval Note:  04/12/2018 9:30 AM  Anthony Shelton  has presented today for surgery, with the diagnosis of LEFT KNEE DEGENERATIVE JOINT DISEASE  The various methods of treatment have been discussed with the patient and family. After consideration of risks, benefits and other options for treatment, the patient has consented to  Procedure(s): LEFT TOTAL KNEE ARTHROPLASTY (Left) as a surgical intervention .  The patient's history has been reviewed, patient examined, no change in status, stable for surgery.  I have reviewed the patient's chart and labs.  Questions were answered to the patient's satisfaction.     Nestor LewandowskyFrank J Abbee Cremeens

## 2018-04-12 NOTE — Anesthesia Preprocedure Evaluation (Addendum)
Anesthesia Evaluation  Patient identified by MRN, date of birth, ID band Patient awake    Reviewed: Allergy & Precautions, NPO status , Patient's Chart, lab work & pertinent test results  Airway Mallampati: I  TM Distance: >3 FB Neck ROM: Full    Dental  (+) Teeth Intact, Dental Advisory Given   Pulmonary former smoker,    breath sounds clear to auscultation       Cardiovascular negative cardio ROS   Rhythm:Regular Rate:Normal     Neuro/Psych PSYCHIATRIC DISORDERS Depression    GI/Hepatic negative GI ROS, Neg liver ROS,   Endo/Other  negative endocrine ROS  Renal/GU negative Renal ROS     Musculoskeletal  (+) Arthritis ,   Abdominal Normal abdominal exam  (+)   Peds  Hematology negative hematology ROS (+)   Anesthesia Other Findings   Reproductive/Obstetrics                            Anesthesia Physical Anesthesia Plan  ASA: II  Anesthesia Plan: Spinal   Post-op Pain Management:  Regional for Post-op pain   Induction: Intravenous  PONV Risk Score and Plan: 2 and Propofol infusion, Ondansetron and Midazolam  Airway Management Planned: Simple Face Mask  Additional Equipment:   Intra-op Plan:   Post-operative Plan:   Informed Consent: I have reviewed the patients History and Physical, chart, labs and discussed the procedure including the risks, benefits and alternatives for the proposed anesthesia with the patient or authorized representative who has indicated his/her understanding and acceptance.     Plan Discussed with: CRNA  Anesthesia Plan Comments:        Anesthesia Quick Evaluation

## 2018-04-12 NOTE — Anesthesia Procedure Notes (Signed)
Spinal  Patient location during procedure: OR End time: 04/12/2018 12:51 PM Staffing Resident/CRNA: Caryl Pina T, CRNA Performed: resident/CRNA  Preanesthetic Checklist Completed: patient identified, site marked, surgical consent, pre-op evaluation, timeout performed, IV checked, risks and benefits discussed and monitors and equipment checked Spinal Block Patient position: sitting Prep: DuraPrep Patient monitoring: heart rate, cardiac monitor, continuous pulse ox and blood pressure Approach: midline Location: L3-4 Injection technique: single-shot Needle Needle type: Pencan  Needle gauge: 24 G Needle length: 9 cm Assessment Sensory level: T6 Additional Notes Expiration date of kit checked and confirmed. Patient tolerated procedure well, without complications.

## 2018-04-12 NOTE — Anesthesia Procedure Notes (Addendum)
Anesthesia Regional Block: Adductor canal block   Pre-Anesthetic Checklist: ,, timeout performed, Correct Patient, Correct Site, Correct Laterality, Correct Procedure, Correct Position, site marked, Risks and benefits discussed,  Surgical consent,  Pre-op evaluation,  At surgeon's request and post-op pain management  Laterality: Left  Prep: chloraprep       Needles:  Injection technique: Single-shot  Needle Type: Echogenic Needle     Needle Length: 9cm  Needle Gauge: 21     Additional Needles:   Procedures:,,,, ultrasound used (permanent image in chart),,,,  Narrative:  Start time: 04/12/2018 10:15 AM End time: 04/12/2018 10:19 AM Injection made incrementally with aspirations every 5 mL.  Performed by: Personally  Anesthesiologist: Shelton SilvasHollis, Kevin D, MD  Additional Notes: Patient tolerated the procedure well. Local anesthetic introduced in an incremental fashion under minimal resistance after negative aspirations. No paresthesias were elicited. After completion of the procedure, no acute issues were identified and patient continued to be monitored by RN.

## 2018-04-12 NOTE — Op Note (Signed)
PATIENT ID:      Anthony Shelton  MRN:     [id number] DOB/AGE:    58-Dec-1962 / 58 y.o.       OPERATIVE REPORT    DATE OF PROCEDURE:  04/12/2018       PREOPERATIVE DIAGNOSIS:   LEFT KNEE DEGENERATIVE JOINT DISEASE      Estimated body mass index is 31.79 kg/m as calculated from the following:   Height as of this encounter: 5\' 7"  (1.702 m).   Weight as of this encounter: 203 lb (92.1 kg).                                                        POSTOPERATIVE DIAGNOSIS:   LEFT KNEE DEGENERATIVE JOINT DISEASE                                                                      PROCEDURE:  Procedure(s): LEFT TOTAL KNEE ARTHROPLASTY Using DepuyAttune RP implants #8L Femur, #8Tibia, 5 mm Attune RP bearing, 38 Patella     SURGEON: Nestor Lewandowsky    ASSISTANT:   Tomi Likens. [employer]   (Present and scrubbed throughout the case, critical for assistance with exposure, retraction, instrumentation, and closure.)         ANESTHESIA: Spinal, 20cc Exparel, 50cc 0.25% Marcaine  EBL: 300cc  FLUID REPLACEMENT: 1600cc crystalloid  TOURNIQUET TIME:  Drains: None  Tranexamic Acid: 1gm IV, 2gm topical  COMPLICATIONS:  None         INDICATIONS FOR PROCEDURE: The patient has  LEFT KNEE DEGENERATIVE JOINT DISEASE, Var deformities, XR shows bone on bone arthritis, lateral subluxation of tibia. Patient has failed all conservative measures including anti-inflammatory medicines, narcotics, attempts at  exercise and weight loss, cortisone injections and viscosupplementation.  Risks and benefits of surgery have been discussed, questions answered.   DESCRIPTION OF PROCEDURE: The patient identified by armband, received  IV antibiotics, in the holding area at [facility]. Patient taken to the operating room, appropriate anesthetic  monitors were attached, and spinal anesthesia was  induced. Tourniquet  applied high to the operative thigh. Lateral post and foot positioner  applied to the table, the lower  extremity was then prepped and draped  in usual sterile fashion from the toes to the tourniquet. Time-out procedure was performed. We began the operation, with the knee flexed 120 degrees, by making the anterior midline incision starting at handbreadth above the patella going over the patella 1 cm medial to and 4 cm distal to the tibial tubercle. Small bleeders in the skin and the  subcutaneous tissue identified and cauterized. Transverse retinaculum was incised and reflected medially and a medial parapatellar arthrotomy was accomplished. the patella was everted and theprepatellar fat pad resected. The superficial medial collateral  ligament was then elevated from anterior to posterior along the proximal  flare of the tibia and anterior half of the menisci resected. The knee was hyperflexed exposing bone on bone arthritis. Peripheral and notch osteophytes as well as the cruciate ligaments were then resected. We continued to  work  our way around posteriorly along the proximal tibia, and externally  rotated the tibia subluxing it out from underneath the femur. A McHale  retractor was placed through the notch and a lateral Hohmann retractor  placed, and we then drilled through the proximal tibia in line with the  axis of the tibia followed by an intramedullary guide rod and 2-degree  posterior slope cutting guide. The tibial cutting guide, 3 degree posterior sloped, was pinned into place allowing resection of 4 mm of bone medially and 12 mm of bone laterally. Satisfied with the tibial resection, we then  entered the distal femur 2 mm anterior to the PCL origin with the  intramedullary guide rod and applied the distal femoral cutting guide  set at 9 mm, with 5 degrees of valgus. This was pinned along the  epicondylar axis. At this point, the distal femoral cut was accomplished without difficulty. We then sized for a #*L femoral component and pinned the guide in 3 degrees of external rotation. The chamfer  cutting guide was pinned into place. The anterior, posterior, and chamfer cuts were accomplished without difficulty followed by  the Attune RP box cutting guide and the box cut. We also removed posterior osteophytes from the posterior femoral condyles. At this  time, the knee was brought into full extension. We checked our  extension and flexion gaps and found them symmetric for a 5 mm bearing. Distracting in extension with a lamina spreader, the posterior horns of the menisci were removed, and Exparel, diluted to 60 cc, with 20cc NS, and 20cc 0.5% Marcaine,was injected into the capsule and synovium of the knee. The posterior patella cut was accomplished with the 9.5 mm Attune cutting guide, sized for a 38mm dome, and the fixation pegs drilled.The knee  was then once again hyperflexed exposing the proximal tibia. We sized for a # 8 tibial base plate, applied the smokestack and the conical reamer followed by the the Delta fin keel punch. We then hammered into place the Attune RP trial femoral component, drilled the lugs, inserted a  5 mm trial bearing, trial patellar button, and took the knee through range of motion from 0-130 degrees. No thumb pressure was required for patellar Tracking. At this point, the limb was wrapped with an Esmarch bandage and the tourniquet inflated to 350 mmHg. All trial components were removed, mating surfaces irrigated with pulse lavage, and dried with suction and sponges. 10 cc of the Exparel solution was applied to the cancellus bone of the patella distal femur and proximal tibia.  After waiting 1 minute, the bony surfaces were again, dried with sponges. A double batch of DePuy HV cement with 1500 mg of Zinacef was mixed and applied to all bony metallic mating surfaces except for the posterior condyles of the femur itself. In order, we hammered into place the tibial tray and removed excess cement, the femoral component and removed excess cement. The final Attune RP bearing  was  inserted, and the knee brought to full extension with compression.  The patellar button was clamped into place, and excess cement  removed. While the cement cured the wound was irrigated out with normal saline solution pulse lavage. Ligament stability and patellar tracking were checked and found to be excellent. The parapatellar arthrotomy was closed with  running #1 Vicryl suture. The subcutaneous tissue with 0 and 2-0 undyed  Vicryl suture, and the skin with running 3-0 SQ vicryl. A dressing of Xeroform,  4 x 4, dressing sponges, Webril, and Ace  wrap applied. The patient  awakened, and taken to recovery room without difficulty.   Nestor LewandowskyFrank J Matrice Herro 04/12/2018, 2:24 PM

## 2018-04-12 NOTE — Anesthesia Postprocedure Evaluation (Signed)
Anesthesia Post Note  Patient: Anthony Shelton  Procedure(s) Performed: LEFT TOTAL KNEE ARTHROPLASTY (Left Knee)     Patient location during evaluation: PACU Anesthesia Type: Spinal Level of consciousness: oriented and awake and alert Pain management: pain level controlled Vital Signs Assessment: post-procedure vital signs reviewed and stable Respiratory status: spontaneous breathing, respiratory function stable and patient connected to nasal cannula oxygen Cardiovascular status: blood pressure returned to baseline and stable Postop Assessment: no headache, no backache, no apparent nausea or vomiting, spinal receding and patient able to bend at knees Anesthetic complications: no    Last Vitals:  Vitals:   04/12/18 1615 04/12/18 1632  BP: (!) 130/99 136/79  Pulse: (!) 54 (!) 55  Resp: 12 15  Temp: 36.7 C (!) 36.3 C  SpO2: 100% 100%    Last Pain:  Vitals:   04/12/18 1615  TempSrc:   PainSc: 0-No pain                 Shelton SilvasKevin D Rosie Golson

## 2018-04-12 NOTE — Discharge Instructions (Signed)

## 2018-04-13 ENCOUNTER — Encounter (HOSPITAL_COMMUNITY): Payer: Self-pay | Admitting: Orthopedic Surgery

## 2018-04-13 DIAGNOSIS — Z720 Tobacco use: Secondary | ICD-10-CM | POA: Insufficient documentation

## 2018-04-13 DIAGNOSIS — M1712 Unilateral primary osteoarthritis, left knee: Secondary | ICD-10-CM | POA: Diagnosis not present

## 2018-04-13 LAB — BASIC METABOLIC PANEL
ANION GAP: 7 (ref 5–15)
BUN: 13 mg/dL (ref 6–20)
CO2: 27 mmol/L (ref 22–32)
Calcium: 8.6 mg/dL — ABNORMAL LOW (ref 8.9–10.3)
Chloride: 105 mmol/L (ref 98–111)
Creatinine, Ser: 0.67 mg/dL (ref 0.61–1.24)
GFR calc non Af Amer: >60 mL/min (ref >60)
Glucose, Bld: 147 mg/dL — ABNORMAL HIGH (ref 70–99)
POTASSIUM: 4.2 mmol/L (ref 3.5–5.1)
Sodium: 139 mmol/L (ref 135–145)

## 2018-04-13 LAB — CBC
HCT: 36.6 % — ABNORMAL LOW (ref 39.0–52.0)
Hemoglobin: 12.5 g/dL — ABNORMAL LOW (ref 13.0–17.0)
MCH: 29.5 pg (ref 26.0–34.0)
MCHC: 34.2 g/dL (ref 30.0–36.0)
MCV: 86.3 fL (ref 78.0–100.0)
Platelets: 262 10*3/uL (ref 150–400)
RBC: 4.24 MIL/uL (ref 4.22–5.81)
RDW: 12.4 % (ref 11.5–15.5)
WBC: 17.1 10*3/uL — ABNORMAL HIGH (ref 4.0–10.5)

## 2018-04-13 NOTE — Progress Notes (Signed)
Discharge planning, spoke with patient and spouse at bedside. Have chosen [facility] for HH PT, evaluate and treat. Contacted [facility] for referral. Needs RW and 3n1, contacted [facility] to deliver to room. [phone] 

## 2018-04-13 NOTE — Progress Notes (Signed)
Physical Therapy Treatment Patient Details Name: Anthony Shelton MRN: [id number] DOB: 19-Jun-1961 Today's Date: 04/13/2018    History of Present Illness 57 yo male s/p L TKA 04/12/18.     PT Comments    Reviewed/practiced gait training and stair training. All education completed. Okay to d/c from PT standpoint-made RN aware.    Follow Up Recommendations  Follow surgeon's recommendation for DC plan and follow-up therapies     Equipment Recommendations  Rolling walker with 5" wheels;3in1 (PT)    Recommendations for Other Services       Precautions / Restrictions Precautions Precautions: Fall;Knee Restrictions Weight Bearing Restrictions: No Other Position/Activity Restrictions: WBAT    Mobility  Bed Mobility Overal bed mobility: Needs Assistance      Supine to sit: Supervision     General bed mobility comments: oob in recliner  Transfers Overall transfer level: Needs assistance Equipment used: Rolling walker (2 wheeled) Transfers: Sit to/from Stand Sit to Stand: Min guard         General transfer comment: VCs safety, hand/LE placement.   Ambulation/Gait Ambulation/Gait assistance: Min guard Gait Distance (Feet): 150 Feet Assistive device: Rolling walker (2 wheeled) Gait Pattern/deviations: Step-to pattern;Step-through pattern;Decreased stride length     General Gait Details: close guard for safety. VCs safety, sequence, distance from RW. Pt able to transition to reciprocal gait pattern as distance progressed.    Stairs Stairs: Yes Stairs assistance: Min assist Stair Management: Step to pattern;With walker Number of Stairs: 2(x2) General stair comments: up and over portable steps x 2. VCs safety, sequence, technique. Wife present to observe and assist pt. Practiced x 1 with 1 HHA (wife assisting), then x 1 with RW and wife assisting.    Wheelchair Mobility    Modified Rankin (Stroke Patients Only)       Balance Overall balance assessment: Mild  deficits observed, not formally tested                                          Cognition Arousal/Alertness: Awake/alert Behavior During Therapy: WFL for tasks assessed/performed Overall Cognitive Status: Within Functional Limits for tasks assessed                                        Exercises Total Joint Exercises Ankle Circles/Pumps: AROM;Both;10 reps;Supine Quad Sets: AROM;Both;10 reps;Supine Hip ABduction/ADduction: AROM;Left;10 reps;Supine Straight Leg Raises: AROM;10 reps;Supine Knee Flexion: AROM;Left;10 reps;Seated Goniometric ROM: ~5-85 degrees    General Comments        Pertinent Vitals/Pain Pain Assessment: 0-10 Pain Score: 3  Pain Location: L knee with activity Pain Descriptors / Indicators: Sore Pain Intervention(s): Monitored during session;Repositioned;Ice applied    Home Living Family/patient expects to be discharged to:: Private residence Living Arrangements: Spouse/significant other Available Help at Discharge: Family Type of Home: House Home Access: Stairs to enter Entrance Stairs-Rails: None Home Layout: One level Home Equipment: None      Prior Function Level of Independence: Independent          PT Goals (current goals can now be found in the care plan section) Acute Rehab PT Goals Patient Stated Goal: home today PT Goal Formulation: With patient/family Time For Goal Achievement: 04/27/18 Potential to Achieve Goals: Good Progress towards PT goals: Progressing toward goals    Frequency  7X/week      PT Plan      Co-evaluation              AM-PAC PT "6 Clicks" Daily Activity  Outcome Measure  Difficulty turning over in bed (including adjusting bedclothes, sheets and blankets)?: A Little Difficulty moving from lying on back to sitting on the side of the bed? : A Little Difficulty sitting down on and standing up from a chair with arms (e.g., wheelchair, bedside commode, etc,.)?: A  Little Help needed moving to and from a bed to chair (including a wheelchair)?: A Little Help needed walking in hospital room?: A Little Help needed climbing 3-5 steps with a railing? : A Little 6 Click Score: 18    End of Session Equipment Utilized During Treatment: Gait belt Activity Tolerance: Patient tolerated treatment well Patient left: in chair;with call bell/phone within reach   PT Visit Diagnosis: Pain;Difficulty in walking, not elsewhere classified (R26.2) Pain - Right/Left: Left Pain - part of body: Knee     Time: [id number] PT Time Calculation (min) (ACUTE ONLY): 12 min  Charges:  $Gait Training: 8-22 mins                      Rebeca AlertJannie Brinklee Cisse, MPT Pager: [phone]

## 2018-04-13 NOTE — Discharge Summary (Signed)
Patient ID: Anthony Shelton MRN: [id number] DOB/AGE: 1961/02/19 57 y.o.  Admit date: 04/12/2018 Discharge date: 04/13/2018  Admission Diagnoses:  Principal Problem:   Degenerative arthritis of left knee Active Problems:   Primary osteoarthritis of left knee   Osteoarthritis of left knee   Discharge Diagnoses:  Same  Past Medical History:  Diagnosis Date  . Depression   . Personal history of tobacco use, presenting hazards to health 06/05/2015  . Spondylosis    Mild, cervical    Surgeries: Procedure(s): LEFT TOTAL KNEE ARTHROPLASTY on 04/12/2018   Consultants:   Discharged Condition: Improved  Hospital Course: Anthony AstersDonald K Pesola is an [age] y.o. male who was admitted 04/12/2018 for operative treatment ofDegenerative arthritis of left knee. Patient has severe unremitting pain that affects sleep, daily activities, and work/hobbies. After pre-op clearance the patient was taken to the operating room on 04/12/2018 and underwent  Procedure(s): LEFT TOTAL KNEE ARTHROPLASTY.    Patient was given perioperative antibiotics:  Anti-infectives (From admission, onward)   Start     Dose/Rate Route Frequency Ordered Stop   04/12/18 1800  amoxicillin (AMOXIL) tablet 500 mg  Status:  Discontinued     500 mg Oral 4 times daily 04/12/18 1640 04/12/18 1702   04/12/18 0930  ceFAZolin (ANCEF) IVPB 2g/100 mL premix     2 g 200 mL/hr over 30 Minutes Intravenous On call to O.R. 04/12/18 [id number] 04/12/18 1252       Patient was given sequential compression devices, early ambulation, and chemoprophylaxis to prevent DVT.  Patient benefited maximally from hospital stay and there were no complications.    Recent vital signs:  Patient Vitals for the past 24 hrs:  BP Temp Temp src Pulse Resp SpO2 Height Weight  04/13/18 0521 100/70 98.3 F (36.8 C) Oral 70 18 97 % no documentation no documentation  04/13/18 0145 111/68 98.3 F (36.8 C) Oral 76 18 99 % no documentation no documentation  04/12/18 2123 (Abnormal)  121/100 98.7 F (37.1 C) Oral 87 18 97 % no documentation no documentation  04/12/18 1933 129/84 97.9 F (36.6 C) Oral 79 18 100 % no documentation no documentation  04/12/18 1826 129/79 no documentation no documentation 78 18 100 % no documentation no documentation  04/12/18 1746 (Abnormal) 141/96 no documentation no documentation 70 no documentation no documentation no documentation no documentation  04/12/18 1723 (Abnormal) 154/106 97.6 F (36.4 C) Oral 68 18 100 % no documentation no documentation  04/12/18 1632 136/79 (Abnormal) 97.4 F (36.3 C) no documentation (Abnormal) 55 15 100 % no documentation no documentation  04/12/18 1615 (Abnormal) 130/99 98 F (36.7 C) no documentation (Abnormal) 54 12 100 % no documentation no documentation  04/12/18 1545 (Abnormal) 119/99 no documentation no documentation 63 16 100 % no documentation no documentation  04/12/18 1530 118/81 no documentation no documentation 60 12 100 % no documentation no documentation  04/12/18 1515 107/71 no documentation no documentation (Abnormal) 58 13 100 % no documentation no documentation  04/12/18 1500 107/72 no documentation no documentation 67 14 100 % no documentation no documentation  04/12/18 1445 no documentation (Abnormal) 97.5 F (36.4 C) no documentation 66 16 100 % no documentation no documentation  04/12/18 1212 107/87 no documentation no documentation (Abnormal) 59 16 100 % no documentation no documentation  04/12/18 1210 no documentation no documentation no documentation (Abnormal) 56 19 100 % no documentation no documentation  04/12/18 1151 139/83 no documentation no documentation (Abnormal) 54 (Abnormal) 22 100 % no documentation no documentation  04/12/18  1136 106/77 no documentation no documentation (Abnormal) 57 17 100 % no documentation no documentation  04/12/18 1121 106/87 no documentation no documentation (Abnormal) 56 12 99 % no documentation no documentation  04/12/18 1106 107/79 no  documentation no documentation (Abnormal) 53 16 100 % no documentation no documentation  04/12/18 1051 116/84 no documentation no documentation (Abnormal) 57 15 99 % no documentation no documentation  04/12/18 1035 106/79 no documentation no documentation 60 16 99 % no documentation no documentation  04/12/18 1030 101/82 no documentation no documentation (Abnormal) 57 13 98 % no documentation no documentation  04/12/18 1027 103/77 no documentation no documentation 62 13 99 % no documentation no documentation  04/12/18 1024 no documentation no documentation no documentation (Abnormal) 59 17 100 % no documentation no documentation  04/12/18 1023 no documentation no documentation no documentation (Abnormal) 59 16 100 % no documentation no documentation  04/12/18 1021 110/76 no documentation no documentation 60 18 100 % no documentation no documentation  04/12/18 1018 no documentation no documentation no documentation 64 14 98 % no documentation no documentation  04/12/18 1017 no documentation no documentation no documentation 64 13 97 % no documentation no documentation  04/12/18 1016 120/83 no documentation no documentation 64 18 97 % no documentation no documentation  04/12/18 1014 no documentation no documentation no documentation 62 14 98 % no documentation no documentation  04/12/18 1013 no documentation no documentation no documentation 62 12 98 % no documentation no documentation  04/12/18 1012 no documentation no documentation no documentation 63 16 98 % no documentation no documentation  04/12/18 1011 (Abnormal) 117/95 no documentation no documentation 60 18 98 % no documentation no documentation  04/12/18 0955 no documentation no documentation no documentation no documentation no documentation no documentation 5\' 7"  (1.702 m) 203 lb (92.1 kg)  04/12/18 0933 (Abnormal) 131/91 97.9 F (36.6 C) Oral 61 16 100 % no documentation no documentation     Recent laboratory studies:  Recent Labs     04/13/18 0356  WBC 17.1*  HGB 12.5*  HCT 36.6*  PLT 262  NA 139  K 4.2  CL 105  CO2 27  BUN 13  CREATININE 0.67  GLUCOSE 147*  CALCIUM 8.6*     Discharge Medications:   Allergies as of 04/13/2018   No Known Allergies     Medication List    Stop taking these medications   etodolac 500 MG tablet Commonly known as:  LODINE     Take these medications   aspirin EC 81 MG tablet Take 1 tablet (81 mg total) by mouth 2 (two) times daily.   buPROPion 150 MG 24 hr tablet Commonly known as:  WELLBUTRIN XL Take 150 mg by mouth daily.   methocarbamol 500 MG tablet Commonly known as:  ROBAXIN Take 1 tablet (500 mg total) by mouth 2 (two) times daily with a meal.   oxyCODONE-acetaminophen 5-325 MG tablet Commonly known as:  PERCOCET/ROXICET Take 1 tablet by mouth every 4 (four) hours as needed for severe pain.   PRESERVISION AREDS 2 PO Take 1 tablet by mouth 2 (two) times daily.        Durable Medical Equipment  (From admission, onward)        Start     Ordered   04/12/18 1640  DME Walker rolling  Once    Question:  Patient needs a walker to treat with the following condition  Answer:  Status post total left knee replacement   04/12/18 1640   04/12/18  1640  DME 3 n 1  Once     04/12/18 1640       Discharge Care Instructions  (From admission, onward)        Start     Ordered   04/13/18 0000  Change dressing    Comments:  Change dressing only if window on dressing has more than 40% drainage otherwise leave alone until follow-up   04/13/18 4098      Diagnostic Studies: Dg Chest 2 View  Result Date: 04/05/2018 CLINICAL DATA:  Preoperative examination prior to knee replacement. EXAM: CHEST - 2 VIEW COMPARISON:  CT scan of the chest of June 05, 2017 and chest x-ray of March 26, 2011. FINDINGS: The lungs are well-expanded. The interstitial markings are coarse though stable. The heart and pulmonary vascularity are normal. The mediastinum is normal in width.  The trachea is midline. The bony thorax exhibits no acute abnormality. IMPRESSION: Mild stable chronic bronchitic-smoking related changes. There is no pneumonia nor other acute cardiopulmonary abnormality. Electronically Signed   By: David  [place] M.D.   On: 04/05/2018 09:00    Disposition: Discharge disposition: 01-Home or Self Care       Discharge Instructions    Call MD / Call 911   Complete by:  As directed    If you experience chest pain or shortness of breath, CALL 911 and be transported to the hospital emergency room.  If you develope a fever above 101 F, pus (white drainage) or increased drainage or redness at the wound, or calf pain, call your surgeon's office.   Change dressing   Complete by:  As directed    Change dressing only if window on dressing has more than 40% drainage otherwise leave alone until follow-up   Constipation Prevention   Complete by:  As directed    Drink plenty of fluids.  Prune juice may be helpful.  You may use a stool softener, such as Colace (over the counter) 100 mg twice a day.  Use MiraLax (over the counter) for constipation as needed.   Diet - low sodium heart healthy   Complete by:  As directed    Increase activity slowly as tolerated   Complete by:  As directed       Follow-up Information    Gean Birchwood, MD In 2 weeks.   Specialty:  Orthopedic Surgery Contact information: [address] [place] [address] [phone]            Signed: Nestor Lewandowsky 04/13/2018, 7:24 AM

## 2018-04-13 NOTE — Progress Notes (Signed)
PATIENT ID: Anthony Shelton  MRN: [id number]  DOB/AGE:  1961-05-15 / 57 y.o.  1 Day Post-Op Procedure(s) (LRB): LEFT TOTAL KNEE ARTHROPLASTY (Left)    PROGRESS NOTE Subjective: Patient is alert, oriented, n0 Nausea, n0 Vomiting, yes passing gas. Taking PO well. Denies SOB, Chest or Calf Pain. Using Incentive Spirometer, PAS in place. Ambulate WBAT, able to do leg lift without any difficulty, flex to 90 degrees, Patient reports pain as 1/10 .    Objective: Vital signs in last 24 hours: Vitals:   04/12/18 1933 04/12/18 2123 04/13/18 0145 04/13/18 0521  BP: 129/84 (Abnormal) 121/100 111/68 100/70  Pulse: 79 87 76 70  Resp: 18 18 18 18   Temp: 97.9 F (36.6 C) 98.7 F (37.1 C) 98.3 F (36.8 C) 98.3 F (36.8 C)  TempSrc: Oral Oral Oral Oral  SpO2: 100% 97% 99% 97%  Weight:      Height:          Intake/Output from previous day: I/O last 3 completed shifts: In: 3749.5 [P.O.:720; I.V.:2779.5; IV Piggyback:250] Out: 4075 [Urine:3925; Blood:150]   Intake/Output this shift: No intake/output data recorded.   LABORATORY DATA: Recent Labs    04/13/18 0356  WBC 17.1*  HGB 12.5*  HCT 36.6*  PLT 262  NA 139  K 4.2  CL 105  CO2 27  BUN 13  CREATININE 0.67  GLUCOSE 147*  CALCIUM 8.6*    Examination: Neurologically intact ABD soft Neurovascular intact Sensation intact distally Intact pulses distally Dorsiflexion/Plantar flexion intact Incision: dressing C/D/I No cellulitis present Compartment soft}  Assessment:   1 Day Post-Op Procedure(s) (LRB): LEFT TOTAL KNEE ARTHROPLASTY (Left) ADDITIONAL DIAGNOSIS: Expected Acute Blood Loss Anemia,   Patient's anticipated LOS is less than 2 midnights, meeting these requirements: - Younger than 7265 - Lives within 1 hour of care - Has a competent adult at home to recover with post-op recover - NO history of  - Chronic pain requiring opiods  - Diabetes  - Coronary Artery Disease  - Heart failure  - Heart attack  - Stroke  -  DVT/VTE  - Cardiac arrhythmia  - Respiratory Failure/COPD  - Renal failure  - Anemia  - Advanced Liver disease       Plan: PT/OT WBAT, AROM and PROM  DVT Prophylaxis:  SCDx72hrs, ASA 81 mg BID x 2 weeks DISCHARGE PLAN: Home, to day after physical therapy  DISCHARGE NEEDS: HHPT, Walker and 3-in-1 comode seat     Nestor LewandowskyFrank J Sharrieff Spratlin 04/13/2018, 7:17 AM

## 2018-04-13 NOTE — Progress Notes (Signed)
Pain well-managed with PRN pain medication. Vitals stable.

## 2018-04-13 NOTE — Progress Notes (Signed)
Discharge instructions discussed with patient and family verbalized agreement and understanding 

## 2018-04-13 NOTE — Evaluation (Signed)
Physical Therapy Evaluation Patient Details Name: Anthony Shelton MRN: [id number] DOB: Dec 30, 1960 Today's Date: 04/13/2018   History of Present Illness  57 yo male s/p L TKA 04/12/18.   Clinical Impression  On eval, pt was Min guard assist for mobility. He walked ~150 feet with a RW. Initiated ROM exercises. Pt stated MD wants him to have a 2nd session prior to d/c. Will practice stair negotiation next session. Spoke with RN about pt's gel packs at pt's/wife's request (she wants to make sure he has them prior to d/c). Plan is for d/c home, with HHPT, later today.     Follow Up Recommendations Follow surgeon's recommendation for DC plan and follow-up therapies    Equipment Recommendations  Rolling walker with 5" wheels;3in1 (PT)    Recommendations for Other Services       Precautions / Restrictions Precautions Precautions: Fall;Knee Restrictions Weight Bearing Restrictions: No Other Position/Activity Restrictions: WBAT      Mobility  Bed Mobility Overal bed mobility: Needs Assistance Bed Mobility: Supine to Sit     Supine to sit: Supervision     General bed mobility comments: for safety.   Transfers Overall transfer level: Needs assistance Equipment used: Rolling walker (2 wheeled) Transfers: Sit to/from Stand Sit to Stand: Min guard         General transfer comment: VCs safety, hand/LE placement.   Ambulation/Gait Ambulation/Gait assistance: Min guard Gait Distance (Feet): 150 Feet Assistive device: Rolling walker (2 wheeled) Gait Pattern/deviations: Step-to pattern;Step-through pattern;Decreased stride length     General Gait Details: close guard for safety. VCs safety, sequence, distance from RW. Pt able to transition to reciprocal gait pattern as distance progressed.   Stairs            Wheelchair Mobility    Modified Rankin (Stroke Patients Only)       Balance                                             Pertinent  Vitals/Pain Pain Assessment: 0-10 Pain Score: 4  Pain Location: L knee with activity Pain Descriptors / Indicators: Sore Pain Intervention(s): Monitored during session;Repositioned;Ice applied    Home Living Family/patient expects to be discharged to:: Private residence Living Arrangements: Spouse/significant other Available Help at Discharge: Family Type of Home: House Home Access: Stairs to enter Entrance Stairs-Rails: None Secretary/administrator of Steps: 2 Home Layout: One level Home Equipment: None      Prior Function Level of Independence: Independent               Hand Dominance        Extremity/Trunk Assessment   Upper Extremity Assessment Upper Extremity Assessment: Overall WFL for tasks assessed    Lower Extremity Assessment Lower Extremity Assessment: LLE deficits/detail LLE Deficits / Details: able to SLR. Knee ROM: 5-85 degrees    Cervical / Trunk Assessment Cervical / Trunk Assessment: Normal  Communication   Communication: No difficulties  Cognition Arousal/Alertness: Awake/alert Behavior During Therapy: WFL for tasks assessed/performed Overall Cognitive Status: Within Functional Limits for tasks assessed                                        General Comments      Exercises Total Joint Exercises Ankle Circles/Pumps: AROM;Both;10 reps;Supine Quad  Sets: AROM;Both;10 reps;Supine Hip ABduction/ADduction: AROM;Left;10 reps;Supine Straight Leg Raises: AROM;10 reps;Supine Knee Flexion: AROM;Left;10 reps;Seated Goniometric ROM: ~5-85 degrees   Assessment/Plan    PT Assessment Patient needs continued PT services  PT Problem List Decreased strength;Decreased range of motion;Decreased mobility;Decreased activity tolerance;Pain;Decreased knowledge of use of DME;Decreased balance       PT Treatment Interventions DME instruction;Gait training;Functional mobility training;Therapeutic activities;Balance training;Patient/family  education;Therapeutic exercise;Stair training    PT Goals (Current goals can be found in the Care Plan section)  Acute Rehab PT Goals Patient Stated Goal: home today PT Goal Formulation: With patient/family Time For Goal Achievement: 04/27/18 Potential to Achieve Goals: Good    Frequency 7X/week   Barriers to discharge        Co-evaluation               AM-PAC PT "6 Clicks" Daily Activity  Outcome Measure Difficulty turning over in bed (including adjusting bedclothes, sheets and blankets)?: A Little Difficulty moving from lying on back to sitting on the side of the bed? : A Little Difficulty sitting down on and standing up from a chair with arms (e.g., wheelchair, bedside commode, etc,.)?: A Little Help needed moving to and from a bed to chair (including a wheelchair)?: A Little Help needed walking in hospital room?: A Little Help needed climbing 3-5 steps with a railing? : A Little 6 Click Score: 18    End of Session Equipment Utilized During Treatment: Gait belt Activity Tolerance: Patient tolerated treatment well Patient left: in chair;with call bell/phone within reach;with family/visitor present   PT Visit Diagnosis: Pain;Difficulty in walking, not elsewhere classified (R26.2) Pain - Right/Left: Left Pain - part of body: Knee    Time: [id number] PT Time Calculation (min) (ACUTE ONLY): 29 min   Charges:   PT Evaluation $PT Eval Low Complexity: 1 Low PT Treatments $Gait Training: 8-22 mins          Rebeca AlertJannie Apple Dearmas, MPT Pager: [phone]

## 2018-05-12 ENCOUNTER — Ambulatory Visit: Payer: Commercial Managed Care - PPO

## 2018-05-22 ENCOUNTER — Telehealth: Payer: Self-pay

## 2018-05-22 NOTE — Telephone Encounter (Signed)
Call pt regarding lung screening. Left message. 

## 2018-05-25 ENCOUNTER — Telehealth: Payer: Self-pay | Admitting: Nurse Practitioner

## 2018-05-28 ENCOUNTER — Telehealth: Payer: Self-pay | Admitting: *Deleted

## 2018-05-28 ENCOUNTER — Encounter: Payer: Self-pay | Admitting: *Deleted

## 2018-05-28 DIAGNOSIS — Z122 Encounter for screening for malignant neoplasm of respiratory organs: Secondary | ICD-10-CM

## 2018-05-28 NOTE — Telephone Encounter (Signed)
Patient has been notified that the annual lung cancer screening low dose CT scan is due currently or will be in the near future.  Confirmed that the patient is within the age range of 3155-80, and asymptomatic, and currently exhibits no signs or symptoms of lung cancer.  Patient denies illness that would prevent curative treatment for lung cancer if found.  Verified smoking history, former smoker quit July 2019 with 37 pkyr history .  The shared decision making visit was completed on 06-05-15.  Patient is agreeable for the CT scan to be scheduled.  Will call patient back with date and time of appointment.

## 2018-05-29 ENCOUNTER — Telehealth: Payer: Self-pay

## 2018-05-29 NOTE — Telephone Encounter (Signed)
Call pt regarding lung screening left message at 10:33am.

## 2018-05-31 ENCOUNTER — Ambulatory Visit: Admit: 2018-05-31 | Payer: Commercial Managed Care - PPO | Admitting: Orthopedic Surgery

## 2018-05-31 SURGERY — ARTHROPLASTY, KNEE, TOTAL
Anesthesia: Spinal | Laterality: Right

## 2018-06-01 ENCOUNTER — Telehealth: Payer: Self-pay | Admitting: Nurse Practitioner

## 2018-06-02 ENCOUNTER — Other Ambulatory Visit: Payer: Self-pay | Admitting: *Deleted

## 2018-06-02 DIAGNOSIS — Z122 Encounter for screening for malignant neoplasm of respiratory organs: Secondary | ICD-10-CM

## 2018-06-09 ENCOUNTER — Encounter: Payer: Self-pay | Admitting: *Deleted

## 2018-06-09 ENCOUNTER — Ambulatory Visit
Admission: RE | Admit: 2018-06-09 | Discharge: 2018-06-09 | Disposition: A | Discharge status: Home / Self Care | Payer: Commercial Managed Care - PPO | Source: Ambulatory Visit | Attending: Oncology | Admitting: Oncology

## 2018-06-09 ENCOUNTER — Ambulatory Visit: Payer: Commercial Managed Care - PPO

## 2018-06-09 DIAGNOSIS — Z122 Encounter for screening for malignant neoplasm of respiratory organs: Secondary | ICD-10-CM | POA: Diagnosis present

## 2018-06-09 DIAGNOSIS — J439 Emphysema, unspecified: Secondary | ICD-10-CM | POA: Insufficient documentation

## 2018-06-16 ENCOUNTER — Inpatient Hospital Stay: Admission: RE | Admit: 2018-06-16 | Payer: Commercial Managed Care - PPO | Source: Ambulatory Visit

## 2018-11-20 ENCOUNTER — Emergency Department (HOSPITAL_COMMUNITY): Payer: Commercial Managed Care - PPO | Admitting: Anesthesiology

## 2018-11-20 ENCOUNTER — Inpatient Hospital Stay (HOSPITAL_COMMUNITY): Payer: Commercial Managed Care - PPO

## 2018-11-20 ENCOUNTER — Emergency Department (HOSPITAL_COMMUNITY): Payer: Commercial Managed Care - PPO

## 2018-11-20 ENCOUNTER — Other Ambulatory Visit: Payer: Self-pay

## 2018-11-20 ENCOUNTER — Inpatient Hospital Stay (HOSPITAL_COMMUNITY)
Admission: EM | Admit: 2018-11-20 | Discharge: 2018-11-21 | DRG: 494 | Disposition: A | Discharge status: Home / Self Care | Payer: Commercial Managed Care - PPO | Attending: Orthopaedic Surgery | Admitting: Orthopaedic Surgery

## 2018-11-20 ENCOUNTER — Encounter (HOSPITAL_COMMUNITY)
Admission: EM | Disposition: A | Discharge status: Home / Self Care | Payer: Self-pay | Source: Home / Self Care | Attending: Orthopaedic Surgery

## 2018-11-20 ENCOUNTER — Encounter (HOSPITAL_COMMUNITY): Payer: Self-pay | Admitting: Emergency Medicine

## 2018-11-20 DIAGNOSIS — Z96652 Presence of left artificial knee joint: Secondary | ICD-10-CM | POA: Diagnosis present

## 2018-11-20 DIAGNOSIS — Z87891 Personal history of nicotine dependence: Secondary | ICD-10-CM

## 2018-11-20 DIAGNOSIS — Z7982 Long term (current) use of aspirin: Secondary | ICD-10-CM | POA: Diagnosis not present

## 2018-11-20 DIAGNOSIS — Y92017 Garden or yard in single-family (private) house as the place of occurrence of the external cause: Secondary | ICD-10-CM | POA: Diagnosis not present

## 2018-11-20 DIAGNOSIS — Y93H9 Activity, other involving exterior property and land maintenance, building and construction: Secondary | ICD-10-CM | POA: Diagnosis not present

## 2018-11-20 DIAGNOSIS — W208XXA Other cause of strike by thrown, projected or falling object, initial encounter: Secondary | ICD-10-CM | POA: Diagnosis present

## 2018-11-20 DIAGNOSIS — S82251A Displaced comminuted fracture of shaft of right tibia, initial encounter for closed fracture: Principal | ICD-10-CM | POA: Diagnosis present

## 2018-11-20 DIAGNOSIS — S82461A Displaced segmental fracture of shaft of right fibula, initial encounter for closed fracture: Secondary | ICD-10-CM | POA: Diagnosis present

## 2018-11-20 DIAGNOSIS — S82209A Unspecified fracture of shaft of unspecified tibia, initial encounter for closed fracture: Secondary | ICD-10-CM | POA: Diagnosis present

## 2018-11-20 DIAGNOSIS — Z419 Encounter for procedure for purposes other than remedying health state, unspecified: Secondary | ICD-10-CM

## 2018-11-20 HISTORY — PX: TIBIA IM NAIL INSERTION: SHX2516

## 2018-11-20 LAB — BASIC METABOLIC PANEL
Anion gap: 4 — ABNORMAL LOW (ref 5–15)
BUN: 15 mg/dL (ref 6–20)
CHLORIDE: 111 mmol/L (ref 98–111)
CO2: 24 mmol/L (ref 22–32)
Calcium: 8.8 mg/dL — ABNORMAL LOW (ref 8.9–10.3)
Creatinine, Ser: 0.94 mg/dL (ref 0.61–1.24)
GFR calc Af Amer: >60 mL/min (ref >60)
GFR calc non Af Amer: >60 mL/min (ref >60)
GLUCOSE: 97 mg/dL (ref 70–99)
POTASSIUM: 3.9 mmol/L (ref 3.5–5.1)
Sodium: 139 mmol/L (ref 135–145)

## 2018-11-20 LAB — CBC WITH DIFFERENTIAL/PLATELET
Abs Immature Granulocytes: 0.07 10*3/uL (ref 0.00–0.07)
BASOS PCT: 0 %
Basophils Absolute: 0.1 10*3/uL (ref 0.0–0.1)
Eosinophils Absolute: 0 10*3/uL (ref 0.0–0.5)
Eosinophils Relative: 0 %
HCT: 40.7 % (ref 39.0–52.0)
Hemoglobin: 13.8 g/dL (ref 13.0–17.0)
Immature Granulocytes: 0 %
LYMPHS PCT: 8 %
Lymphs Abs: 1.4 10*3/uL (ref 0.7–4.0)
MCH: 29.4 pg (ref 26.0–34.0)
MCHC: 33.9 g/dL (ref 30.0–36.0)
MCV: 86.6 fL (ref 80.0–100.0)
MONO ABS: 1.1 10*3/uL — AB (ref 0.1–1.0)
MONOS PCT: 6 %
NEUTROS ABS: 14.5 10*3/uL — AB (ref 1.7–7.7)
Neutrophils Relative %: 86 %
PLATELETS: 286 10*3/uL (ref 150–400)
RBC: 4.7 MIL/uL (ref 4.22–5.81)
RDW: 12.9 % (ref 11.5–15.5)
WBC: 17.2 10*3/uL — ABNORMAL HIGH (ref 4.0–10.5)
nRBC: 0 % (ref 0.0–0.2)

## 2018-11-20 SURGERY — INSERTION, INTRAMEDULLARY ROD, TIBIA
Anesthesia: General | Laterality: Right

## 2018-11-20 MED ORDER — ROCURONIUM BROMIDE 50 MG/5ML IV SOSY
PREFILLED_SYRINGE | INTRAVENOUS | Status: AC
  Filled 2018-11-20: qty 5

## 2018-11-20 MED ORDER — KETOROLAC TROMETHAMINE 15 MG/ML IJ SOLN
15.0000 mg | Freq: Four times a day (QID) | INTRAMUSCULAR | Status: DC
  Administered 2018-11-21: 15 mg via INTRAVENOUS
  Filled 2018-11-20: qty 1

## 2018-11-20 MED ORDER — ONDANSETRON HCL 4 MG/2ML IJ SOLN: 4.0000 mg | Freq: Four times a day (QID) | INTRAMUSCULAR | Status: DC | PRN

## 2018-11-20 MED ORDER — POVIDONE-IODINE 10 % EX SWAB: 2.0000 "application " | Freq: Once | CUTANEOUS | Status: DC

## 2018-11-20 MED ORDER — PROPOFOL 10 MG/ML IV BOLUS
INTRAVENOUS | Status: DC | PRN
  Administered 2018-11-20: 150 mg via INTRAVENOUS

## 2018-11-20 MED ORDER — FENTANYL CITRATE (PF) 250 MCG/5ML IJ SOLN
INTRAMUSCULAR | Status: AC
  Filled 2018-11-20: qty 5

## 2018-11-20 MED ORDER — ACETAMINOPHEN 325 MG PO TABS: 650.0000 mg | ORAL_TABLET | Freq: Four times a day (QID) | ORAL | Status: DC | PRN

## 2018-11-20 MED ORDER — SUGAMMADEX SODIUM 200 MG/2ML IV SOLN
INTRAVENOUS | Status: DC | PRN
  Administered 2018-11-20: 200 mg via INTRAVENOUS

## 2018-11-20 MED ORDER — DIPHENHYDRAMINE HCL 12.5 MG/5ML PO ELIX: 12.5000 mg | ORAL_SOLUTION | ORAL | Status: DC | PRN

## 2018-11-20 MED ORDER — METOCLOPRAMIDE HCL 5 MG PO TABS: 5.0000 mg | ORAL_TABLET | Freq: Three times a day (TID) | ORAL | Status: DC | PRN

## 2018-11-20 MED ORDER — CEFAZOLIN SODIUM-DEXTROSE 2-4 GM/100ML-% IV SOLN
2.0000 g | Freq: Four times a day (QID) | INTRAVENOUS | Status: AC
  Administered 2018-11-20 – 2018-11-21 (×3): 2 g via INTRAVENOUS
  Filled 2018-11-20 (×3): qty 100

## 2018-11-20 MED ORDER — ONDANSETRON HCL 4 MG/2ML IJ SOLN
INTRAMUSCULAR | Status: AC
  Filled 2018-11-20: qty 2

## 2018-11-20 MED ORDER — ACETAMINOPHEN 650 MG RE SUPP: 650.0000 mg | Freq: Four times a day (QID) | RECTAL | Status: DC | PRN

## 2018-11-20 MED ORDER — CEFAZOLIN SODIUM-DEXTROSE 2-4 GM/100ML-% IV SOLN
INTRAVENOUS | Status: AC
  Filled 2018-11-20: qty 100

## 2018-11-20 MED ORDER — ACETAMINOPHEN 10 MG/ML IV SOLN
INTRAVENOUS | Status: DC | PRN
  Administered 2018-11-20: 1000 mg via INTRAVENOUS

## 2018-11-20 MED ORDER — DEXAMETHASONE SODIUM PHOSPHATE 10 MG/ML IJ SOLN
INTRAMUSCULAR | Status: DC | PRN
  Administered 2018-11-20: 10 mg via INTRAVENOUS

## 2018-11-20 MED ORDER — EPHEDRINE SULFATE 50 MG/ML IJ SOLN
INTRAMUSCULAR | Status: DC | PRN
  Administered 2018-11-20: 10 mg via INTRAVENOUS

## 2018-11-20 MED ORDER — METHOCARBAMOL 500 MG PO TABS
500.0000 mg | ORAL_TABLET | Freq: Four times a day (QID) | ORAL | Status: DC | PRN
  Administered 2018-11-21: 500 mg via ORAL
  Filled 2018-11-20: qty 1

## 2018-11-20 MED ORDER — FENTANYL CITRATE (PF) 100 MCG/2ML IJ SOLN
25.0000 ug | INTRAMUSCULAR | Status: DC | PRN
  Administered 2018-11-20 (×2): 50 ug via INTRAVENOUS

## 2018-11-20 MED ORDER — 0.9 % SODIUM CHLORIDE (POUR BTL) OPTIME
TOPICAL | Status: DC | PRN
  Administered 2018-11-20: 1000 mL

## 2018-11-20 MED ORDER — MORPHINE SULFATE (PF) 4 MG/ML IV SOLN
4.0000 mg | Freq: Once | INTRAVENOUS | Status: AC
  Administered 2018-11-20: 4 mg via INTRAVENOUS
  Filled 2018-11-20: qty 1

## 2018-11-20 MED ORDER — SUCCINYLCHOLINE CHLORIDE 200 MG/10ML IV SOSY
PREFILLED_SYRINGE | INTRAVENOUS | Status: AC
  Filled 2018-11-20: qty 10

## 2018-11-20 MED ORDER — METHOCARBAMOL 1000 MG/10ML IJ SOLN
500.0000 mg | Freq: Four times a day (QID) | INTRAVENOUS | Status: DC | PRN
  Filled 2018-11-20: qty 5

## 2018-11-20 MED ORDER — FENTANYL CITRATE (PF) 100 MCG/2ML IJ SOLN
INTRAMUSCULAR | Status: AC
  Filled 2018-11-20: qty 2

## 2018-11-20 MED ORDER — ONDANSETRON HCL 4 MG PO TABS: 4.0000 mg | ORAL_TABLET | Freq: Four times a day (QID) | ORAL | Status: DC | PRN

## 2018-11-20 MED ORDER — MIDAZOLAM HCL 2 MG/2ML IJ SOLN
INTRAMUSCULAR | Status: AC
  Filled 2018-11-20: qty 2

## 2018-11-20 MED ORDER — MORPHINE SULFATE (PF) 2 MG/ML IV SOLN
2.0000 mg | INTRAVENOUS | Status: DC | PRN
  Administered 2018-11-20 – 2018-11-21 (×2): 2 mg via INTRAVENOUS
  Filled 2018-11-20 (×2): qty 1

## 2018-11-20 MED ORDER — PHENYLEPHRINE 40 MCG/ML (10ML) SYRINGE FOR IV PUSH (FOR BLOOD PRESSURE SUPPORT)
PREFILLED_SYRINGE | INTRAVENOUS | Status: AC
  Filled 2018-11-20: qty 10

## 2018-11-20 MED ORDER — ONDANSETRON HCL 4 MG/2ML IJ SOLN
INTRAMUSCULAR | Status: DC | PRN
  Administered 2018-11-20: 4 mg via INTRAVENOUS

## 2018-11-20 MED ORDER — TETANUS-DIPHTH-ACELL PERTUSSIS 5-2.5-18.5 LF-MCG/0.5 IM SUSP
0.5000 mL | Freq: Once | INTRAMUSCULAR | Status: AC
  Administered 2018-11-20: 0.5 mL via INTRAMUSCULAR
  Filled 2018-11-20: qty 0.5

## 2018-11-20 MED ORDER — CEFAZOLIN SODIUM-DEXTROSE 2-4 GM/100ML-% IV SOLN
2.0000 g | INTRAVENOUS | Status: AC
  Administered 2018-11-20: 2 g via INTRAVENOUS

## 2018-11-20 MED ORDER — CHLORHEXIDINE GLUCONATE 4 % EX LIQD: 60.0000 mL | Freq: Once | CUTANEOUS | Status: DC

## 2018-11-20 MED ORDER — LACTATED RINGERS IV SOLN
INTRAVENOUS | Status: DC | PRN
  Administered 2018-11-20 (×2): via INTRAVENOUS

## 2018-11-20 MED ORDER — DOCUSATE SODIUM 100 MG PO CAPS
100.0000 mg | ORAL_CAPSULE | Freq: Two times a day (BID) | ORAL | Status: DC
  Filled 2018-11-20 (×2): qty 1

## 2018-11-20 MED ORDER — ONDANSETRON HCL 4 MG/2ML IJ SOLN
4.0000 mg | Freq: Once | INTRAMUSCULAR | Status: AC
  Administered 2018-11-20: 4 mg via INTRAVENOUS
  Filled 2018-11-20: qty 2

## 2018-11-20 MED ORDER — LACTATED RINGERS IV SOLN: INTRAVENOUS | Status: DC

## 2018-11-20 MED ORDER — METOCLOPRAMIDE HCL 5 MG/ML IJ SOLN: 5.0000 mg | Freq: Three times a day (TID) | INTRAMUSCULAR | Status: DC | PRN

## 2018-11-20 MED ORDER — OXYCODONE HCL 5 MG PO TABS
5.0000 mg | ORAL_TABLET | ORAL | Status: DC | PRN
  Administered 2018-11-21: 10 mg via ORAL
  Filled 2018-11-20: qty 2

## 2018-11-20 MED ORDER — ONDANSETRON HCL 4 MG/2ML IJ SOLN: 4.0000 mg | Freq: Once | INTRAMUSCULAR | Status: DC | PRN

## 2018-11-20 MED ORDER — ENOXAPARIN SODIUM 40 MG/0.4ML ~~LOC~~ SOLN
40.0000 mg | SUBCUTANEOUS | Status: DC
  Administered 2018-11-21: 40 mg via SUBCUTANEOUS
  Filled 2018-11-20: qty 0.4

## 2018-11-20 MED ORDER — MIDAZOLAM HCL 2 MG/2ML IJ SOLN
INTRAMUSCULAR | Status: DC | PRN
  Administered 2018-11-20: 2 mg via INTRAVENOUS

## 2018-11-20 MED ORDER — LIDOCAINE 2% (20 MG/ML) 5 ML SYRINGE
INTRAMUSCULAR | Status: AC
  Filled 2018-11-20: qty 5

## 2018-11-20 MED ORDER — ACETAMINOPHEN 10 MG/ML IV SOLN
INTRAVENOUS | Status: AC
  Filled 2018-11-20: qty 100

## 2018-11-20 MED ORDER — FENTANYL CITRATE (PF) 250 MCG/5ML IJ SOLN
INTRAMUSCULAR | Status: DC | PRN
  Administered 2018-11-20: 50 ug via INTRAVENOUS
  Administered 2018-11-20: 25 ug via INTRAVENOUS
  Administered 2018-11-20: 50 ug via INTRAVENOUS
  Administered 2018-11-20: 25 ug via INTRAVENOUS
  Administered 2018-11-20: 50 ug via INTRAVENOUS
  Administered 2018-11-20: 25 ug via INTRAVENOUS
  Administered 2018-11-20: 50 ug via INTRAVENOUS

## 2018-11-20 MED ORDER — ROCURONIUM BROMIDE 10 MG/ML (PF) SYRINGE
PREFILLED_SYRINGE | INTRAVENOUS | Status: DC | PRN
  Administered 2018-11-20: 50 mg via INTRAVENOUS

## 2018-11-20 MED ORDER — HYDROMORPHONE HCL 1 MG/ML IJ SOLN
0.5000 mg | Freq: Once | INTRAMUSCULAR | Status: AC
  Administered 2018-11-20: 0.5 mg via INTRAVENOUS
  Filled 2018-11-20: qty 1

## 2018-11-20 MED ORDER — PROPOFOL 10 MG/ML IV BOLUS
INTRAVENOUS | Status: AC
  Filled 2018-11-20: qty 20

## 2018-11-20 SURGICAL SUPPLY — 62 items
BANDAGE ACE 4X5 VEL STRL LF (GAUZE/BANDAGES/DRESSINGS) ×3 IMPLANT
BANDAGE ACE 6X5 VEL STRL LF (GAUZE/BANDAGES/DRESSINGS) ×3 IMPLANT
BANDAGE ELASTIC 6 VELCRO ST LF (GAUZE/BANDAGES/DRESSINGS) ×3 IMPLANT
BANDAGE ESMARK 6X9 LF (GAUZE/BANDAGES/DRESSINGS) IMPLANT
BIT DRILL CALIBRATED 4.2 (BIT) ×1 IMPLANT
BIT DRILL SHORT 4.2 (BIT) ×1 IMPLANT
BNDG COHESIVE 4X5 TAN STRL (GAUZE/BANDAGES/DRESSINGS) ×3 IMPLANT
BNDG ESMARK 6X9 LF (GAUZE/BANDAGES/DRESSINGS)
BNDG GAUZE ELAST 4 BULKY (GAUZE/BANDAGES/DRESSINGS) ×3 IMPLANT
COVER SURGICAL LIGHT HANDLE (MISCELLANEOUS) ×6 IMPLANT
COVER WAND RF STERILE (DRAPES) ×3 IMPLANT
CUFF TOURNIQUET SINGLE 34IN LL (TOURNIQUET CUFF) IMPLANT
CUFF TOURNIQUET SINGLE 44IN (TOURNIQUET CUFF) IMPLANT
DRAPE C-ARM 42X72 X-RAY (DRAPES) ×3 IMPLANT
DRAPE IMP U-DRAPE 54X76 (DRAPES) ×3 IMPLANT
DRAPE INCISE IOBAN 66X45 STRL (DRAPES) ×3 IMPLANT
DRAPE ORTHO SPLIT 77X108 STRL (DRAPES) ×4
DRAPE SURG ORHT 6 SPLT 77X108 (DRAPES) ×2 IMPLANT
DRAPE U-SHAPE 47X51 STRL (DRAPES) ×3 IMPLANT
DRILL BIT CALIBRATED 4.2 (BIT) ×3
DRILL BIT SHORT 4.2 (BIT) ×2
DURAPREP 26ML APPLICATOR (WOUND CARE) ×3 IMPLANT
ELECT REM PT RETURN 9FT ADLT (ELECTROSURGICAL) ×3
ELECTRODE REM PT RTRN 9FT ADLT (ELECTROSURGICAL) ×1 IMPLANT
EVACUATOR 1/8 PVC DRAIN (DRAIN) IMPLANT
FACESHIELD OPICON LG (MASK) ×6 IMPLANT
GAUZE SPONGE 4X4 12PLY STRL (GAUZE/BANDAGES/DRESSINGS) ×3 IMPLANT
GAUZE SPONGE 4X4 12PLY STRL LF (GAUZE/BANDAGES/DRESSINGS) ×3 IMPLANT
GAUZE XEROFORM 1X8 LF (GAUZE/BANDAGES/DRESSINGS) ×3 IMPLANT
GAUZE XEROFORM 5X9 LF (GAUZE/BANDAGES/DRESSINGS) ×3 IMPLANT
GLOVE BIOGEL PI IND STRL 8 (GLOVE) ×4 IMPLANT
GLOVE BIOGEL PI INDICATOR 8 (GLOVE) ×8
GLOVE ORTHO TXT STRL SZ7.5 (GLOVE) ×18 IMPLANT
GLOVE SURG ORTHO 8.0 STRL STRW (GLOVE) ×12 IMPLANT
GOWN STRL REUS W/ TWL LRG LVL3 (GOWN DISPOSABLE) ×3 IMPLANT
GOWN STRL REUS W/ TWL XL LVL3 (GOWN DISPOSABLE) ×2 IMPLANT
GOWN STRL REUS W/TWL LRG LVL3 (GOWN DISPOSABLE) ×6
GOWN STRL REUS W/TWL XL LVL3 (GOWN DISPOSABLE) ×4
GUIDEWIRE 3.2X400 (WIRE) ×3 IMPLANT
KIT BASIN OR (CUSTOM PROCEDURE TRAY) ×3 IMPLANT
KIT TURNOVER KIT B (KITS) ×3 IMPLANT
NAIL TIBIAL EX W/BEND 10X345M (Nail) ×3 IMPLANT
PACK GENERAL/GYN (CUSTOM PROCEDURE TRAY) ×3 IMPLANT
PACK UNIVERSAL I (CUSTOM PROCEDURE TRAY) ×3 IMPLANT
PAD ARMBOARD 7.5X6 YLW CONV (MISCELLANEOUS) ×6 IMPLANT
PADDING CAST COTTON 6X4 STRL (CAST SUPPLIES) ×6 IMPLANT
PADDING CAST SYNTHETIC 4 (CAST SUPPLIES) ×6
PADDING CAST SYNTHETIC 4X4 STR (CAST SUPPLIES) ×3 IMPLANT
REAMER ROD DEEP FLUTE 2.5X950 (INSTRUMENTS) ×3 IMPLANT
SCREW LOCK STAR 5X36 (Screw) ×3 IMPLANT
SCREW LOCK STAR 5X40 (Screw) ×3 IMPLANT
SCREW LOCK STAR 5X42 (Screw) ×3 IMPLANT
SCREW LOCK STAR 5X46 (Screw) ×3 IMPLANT
STAPLER VISISTAT 35W (STAPLE) ×3 IMPLANT
SUT VIC AB 1 CTB1 27 (SUTURE) ×3 IMPLANT
SUT VIC AB 2-0 CT1 27 (SUTURE)
SUT VIC AB 2-0 CT1 TAPERPNT 27 (SUTURE) IMPLANT
TOWEL OR 17X24 6PK STRL BLUE (TOWEL DISPOSABLE) ×3 IMPLANT
TOWEL OR 17X26 10 PK STRL BLUE (TOWEL DISPOSABLE) ×3 IMPLANT
TUBE CONNECTING 12'X1/4 (SUCTIONS) ×1
TUBE CONNECTING 12X1/4 (SUCTIONS) ×2 IMPLANT
YANKAUER SUCT BULB TIP NO VENT (SUCTIONS) ×3 IMPLANT

## 2018-11-20 NOTE — Progress Notes (Signed)
Patient requesting full diet order. RN paged [facility] awaiting return phone call from Dr. Susa Simmonds.

## 2018-11-20 NOTE — ED Notes (Signed)
Patient transported to X-ray 

## 2018-11-20 NOTE — Anesthesia Preprocedure Evaluation (Signed)
Anesthesia Evaluation  Patient identified by MRN, date of birth, ID band Patient awake    Reviewed: Allergy & Precautions, NPO status , Patient's Chart, lab work & pertinent test results  Airway Mallampati: II  TM Distance: >3 FB Neck ROM: Full    Dental  (+) Dental Advisory Given   Pulmonary former smoker,    breath sounds clear to auscultation       Cardiovascular negative cardio ROS   Rhythm:Regular Rate:Normal     Neuro/Psych Depression negative neurological ROS     GI/Hepatic negative GI ROS, Neg liver ROS,   Endo/Other  negative endocrine ROS  Renal/GU negative Renal ROS     Musculoskeletal  (+) Arthritis ,   Abdominal   Peds  Hematology negative hematology ROS (+)   Anesthesia Other Findings   Reproductive/Obstetrics                             Lab Results  Component Value Date   WBC 17.2 (H) 11/20/2018   HGB 13.8 11/20/2018   HCT 40.7 11/20/2018   MCV 86.6 11/20/2018   PLT 286 11/20/2018   Lab Results  Component Value Date   CREATININE 0.94 11/20/2018   BUN 15 11/20/2018   NA 139 11/20/2018   K 3.9 11/20/2018   CL 111 11/20/2018   CO2 24 11/20/2018    Anesthesia Physical Anesthesia Plan  ASA: II  Anesthesia Plan: General   Post-op Pain Management:    Induction: Intravenous  PONV Risk Score and Plan: 2 and Dexamethasone, Ondansetron and Treatment may vary due to age or medical condition  Airway Management Planned: Oral ETT  Additional Equipment:   Intra-op Plan:   Post-operative Plan: Extubation in OR  Informed Consent: I have reviewed the patients History and Physical, chart, labs and discussed the procedure including the risks, benefits and alternatives for the proposed anesthesia with the patient or authorized representative who has indicated his/her understanding and acceptance.     Dental advisory given  Plan Discussed with: CRNA  Anesthesia  Plan Comments:         Anesthesia Quick Evaluation

## 2018-11-20 NOTE — Transfer of Care (Signed)
Immediate Anesthesia Transfer of Care Note  Patient: Anthony Shelton  Procedure(s) Performed: INTRAMEDULLARY (IM) NAIL TIBIAL (Right )  Patient Location: PACU  Anesthesia Type:General  Level of Consciousness: awake  Airway & Oxygen Therapy: Patient Spontanous Breathing  Post-op Assessment: Report given to RN and Post -op Vital signs reviewed and stable  Post vital signs: Reviewed and stable  Last Vitals:  Vitals Value Taken Time  BP    Temp    Pulse    Resp    SpO2      Last Pain:  Vitals:   11/20/18 1937  TempSrc:   PainSc: 3          Complications: No apparent anesthesia complications

## 2018-11-20 NOTE — Anesthesia Postprocedure Evaluation (Signed)
Anesthesia Post Note  Patient: Anthony Shelton  Procedure(s) Performed: INTRAMEDULLARY (IM) NAIL TIBIAL (Right )     Patient location during evaluation: PACU Anesthesia Type: General Level of consciousness: awake and alert Pain management: pain level controlled Vital Signs Assessment: post-procedure vital signs reviewed and stable Respiratory status: spontaneous breathing, nonlabored ventilation, respiratory function stable and patient connected to nasal cannula oxygen Cardiovascular status: blood pressure returned to baseline and stable Postop Assessment: no apparent nausea or vomiting Anesthetic complications: no    Last Vitals:  Vitals:   11/20/18 1930 11/20/18 1937  BP: 123/80 129/84  Pulse: 74 72  Resp: 12 13  Temp: 36.6 C 36.6 C  SpO2: 99% 97%    Last Pain:  Vitals:   11/20/18 1937  TempSrc:   PainSc: 3                  Kennieth Rad

## 2018-11-20 NOTE — H&P (Signed)
Anthony Shelton is an 58 y.o. male.   Chief Complaint: Right tibial shaft fracture Right segmental fibula fracture with lateral malleolus component HPI: Patient was chopping down a tree and the tree fell and pinned his right leg between the tree and the stump.  He had immediate pain and deformity in his right leg.  He was brought to the emergency department diagnosed with a right tibia fracture and fibula fracture.  Patient complains of discomfort in this area.  He has no pain in his left leg or bilateral upper extremities.  He denies any numbness or tingling in the right lower extremity.  The pain is been improving with elevation and pain medication.  Patient reports being a smoker.  Past Medical History:  Diagnosis Date  . Depression   . Personal history of tobacco use, presenting hazards to health 06/05/2015  . Spondylosis    Mild, cervical    Past Surgical History:  Procedure Laterality Date  . COLONOSCOPY    . dislocated elbow    . TOTAL KNEE ARTHROPLASTY Left 04/12/2018   Procedure: LEFT TOTAL KNEE ARTHROPLASTY;  Surgeon: Gean Birchwood, MD;  Location: [facility];  Service: Orthopedics;  Laterality: Left;    No family history on file. Social History:  reports that he quit smoking about 8 months ago. His smoking use included cigarettes. He has a 37.00 pack-year smoking history. He has never used smokeless tobacco. He reports that he does not drink alcohol or use drugs.  Allergies: No Known Allergies  (Not in a hospital admission)   Results for orders placed or performed during the hospital encounter of 11/20/18 (from the past 48 hour(s))  Basic metabolic panel     Status: Abnormal   Collection Time: 11/20/18 12:37 PM  Result Value Ref Range   Sodium 139 135 - 145 mmol/L   Potassium 3.9 3.5 - 5.1 mmol/L   Chloride 111 98 - 111 mmol/L   CO2 24 22 - 32 mmol/L   Glucose, Bld 97 70 - 99 mg/dL   BUN 15 6 - 20 mg/dL   Creatinine, Ser 8.76 0.61 - 1.24 mg/dL   Calcium 8.8 (L) 8.9 - 10.3  mg/dL   GFR calc non Af Amer >60 >60 mL/min   GFR calc Af Amer >60 >60 mL/min   Anion gap 4 (L) 5 - 15    Comment: Performed at [facility], [address], [place], [place] [address]  CBC with Differential     Status: Abnormal   Collection Time: 11/20/18 12:37 PM  Result Value Ref Range   WBC 17.2 (H) 4.0 - 10.5 K/uL   RBC 4.70 4.22 - 5.81 MIL/uL   Hemoglobin 13.8 13.0 - 17.0 g/dL   HCT 26.2 03.5 - 59.7 %   MCV 86.6 80.0 - 100.0 fL   MCH 29.4 26.0 - 34.0 pg   MCHC 33.9 30.0 - 36.0 g/dL   RDW 41.6 38.4 - 53.6 %   Platelets 286 150 - 400 K/uL   nRBC 0.0 0.0 - 0.2 %   Neutrophils Relative % 86 %   Neutro Abs 14.5 (H) 1.7 - 7.7 K/uL   Lymphocytes Relative 8 %   Lymphs Abs 1.4 0.7 - 4.0 K/uL   Monocytes Relative 6 %   Monocytes Absolute 1.1 (H) 0.1 - 1.0 K/uL   Eosinophils Relative 0 %   Eosinophils Absolute 0.0 0.0 - 0.5 K/uL   Basophils Relative 0 %   Basophils Absolute 0.1 0.0 - 0.1 K/uL  Immature Granulocytes 0 %   Abs Immature Granulocytes 0.07 0.00 - 0.07 K/uL    Comment: Performed at [facility], [address] [place], [place] [address]   Dg Knee 1-2 Views Right  Result Date: 11/20/2018 CLINICAL DATA:  Pain.  Preoperative assessment EXAM: RIGHT KNEE - 1-2 VIEW COMPARISON:  Jan 18, 2016 FINDINGS: Frontal and lateral views were obtained. There is a fracture at the junction of the proximal metaphysis and diaphysis of the fibula with alignment near anatomic. No other fracture appreciable in the knee region. No dislocation. There is a focal joint effusion. There is moderate joint space narrowing medially and in the patellofemoral joint. There is spurring in all compartments. No erosive change. IMPRESSION: 1. Fracture proximal fibula with alignment near anatomic. No dislocation in the knee region. 2. joint effusion, fairly small. 3. Osteoarthritic change, most notably involving the medial compartment and patellofemoral joint. There is spurring in all compartments,  however. Electronically Signed   By: Bretta Bang III M.D.   On: 11/20/2018 15:22   Dg Tibia/fibula Right  Result Date: 11/20/2018 CLINICAL DATA:  Tree fell on leg. EXAM: RIGHT TIBIA AND FIBULA - 2 VIEW COMPARISON:  None. FINDINGS: There are 3 fractures of the fibula. There is a nondisplaced fracture through the fibular neck, a nondisplaced transverse fracture through the midshaft and a minimally displaced oblique coursing distal fibular shaft fracture above the level of the ankle mortise. Complex comminuted but minimally displaced fracture of the mid tibial shaft. The knee and ankle joints are maintained. Degenerative changes are noted. IMPRESSION: Tibia and fibular fractures as detailed above. Electronically Signed   By: Rudie Meyer M.D.   On: 11/20/2018 13:26   Dg Ankle Complete Right  Result Date: 11/20/2018 CLINICAL DATA:  Tree fell on leg. EXAM: RIGHT ANKLE - COMPLETE 3+ VIEW COMPARISON:  Tib-fib films from earlier today. FINDINGS: The ankle mortise is maintained. There is an oblique coursing distal fibular fracture above the level of the ankle mortise. No significant displacement. No distal tibial fracture. The talus is normal. The subtalar joints are maintained. No mid or hindfoot fractures. IMPRESSION: Oblique coursing distal fibular fracture above the level of the ankle mortise. No distal tibial fractures. Mid tibia and fibular fractures as demonstrated on the earlier tib-fib films. Electronically Signed   By: Rudie Meyer M.D.   On: 11/20/2018 15:22    Review of Systems  Constitutional: Negative.   HENT: Negative.   Eyes: Negative.   Gastrointestinal: Negative.   Musculoskeletal:       Right leg pain  Skin: Negative.   Neurological: Negative.   Psychiatric/Behavioral: Negative.     Blood pressure 120/82, pulse 72, temperature 98.1 F (36.7 C), temperature source Oral, resp. rate 17, height  (1.702 m), weight 88.9 kg, SpO2 99 %. Physical Exam  Constitutional: He  appears well-developed.  HENT:  Head: Normocephalic.  Eyes: Conjunctivae are normal.  Neck: Neck supple.  Cardiovascular: Normal rate.  Respiratory: Effort normal.  GI: Soft.  Musculoskeletal:     Comments: Right lower extremity demonstrates swelling and deformity through the leg.  There is an anterior abrasion that superficial.  There is also a posterior lateral abrasion about the knee that is superficial.  Patient has no tenderness about the thigh or knee.  He has tenderness about the leg but compartments are soft.  Some tenderness about the distal fibula.  No tenderness about the foot.  Patient endorses sensation to light touch in the dorsal plantar surface  of the foot.  Did not assess motor of the knee given his injury but he is able to dorsiflex and plantarflex ankle albeit weakly.  He has a palpable dorsalis pedis pulse.No pain with passive stretch of hallux on the right.  No evidence of upper extremity injury or left lower extremity injury.     Assessment/Plan We will plan for intramedullary nail fixation of his right tibia.  I will assess ankle stability at the time of the surgery to assess whether this lateral malleolus fracture needs to be fixed.  We will admit him to the hospital for pain control and mobilization.  Hopefully he will be amenable for discharge within the next couple of days.  He will be placed on Lovenox postoperatively for DVT prophylaxis and be sent home on 14 days of Lovenox.  He will remain bedrest for now and be touchdown weightbearing after the surgery.  Currently he has no signs of compartment syndrome.  We discussed the risk, benefits alternatives surgery which include but were not limited to wound healing complications, infection, need for further surgery, malunion or nonunion.  We also discussed the perioperative and anesthetic risk which included death.  He is a smoker and therefore we discussed the increased risk of wound healing complications as well as the  development of the nonunion.  He understand these risks and wished to proceed.  He will remain n.p.o. for now until we can get to the surgery this afternoon.  Terance Hart, MD 11/20/2018, 3:24 PM

## 2018-11-20 NOTE — ED Provider Notes (Signed)
[facility] EMERGENCY DEPARTMENT Provider Note   CSN: [id number] Arrival date & time: 11/20/18  1209    History   Chief Complaint Chief Complaint  Patient presents with  . Knee Pain    HPI Anthony Shelton is a 58 y.o. male who presents to the ER for c/o injury to the RLE. The patient was clearing a tree from his yard with a chainsaw. As he cut the trunk it tipped forward and fell onto his R leg. He was trapped under the wood for approximately 3 minutes  Before family removed it. He c/o sever pain in the RLE. EMS reports deformity with normal sensation and cap refill <2. Patient unsure of last TDAP.  He denies injury to his chest abdomen or pelvis.  He did not hit his head or lose consciousness.     HPI  Past Medical History:  Diagnosis Date  . Depression   . Personal history of tobacco use, presenting hazards to health 06/05/2015  . Spondylosis    Mild, cervical    Patient Active Problem List   Diagnosis Date Noted  . Closed tibia fracture 11/20/2018  . Tobacco abuse 04/13/2018  . Primary osteoarthritis of left knee 04/12/2018  . Osteoarthritis of left knee 04/12/2018  . Degenerative arthritis of left knee 04/08/2018  . Personal history of tobacco use, presenting hazards to health 06/05/2015    Past Surgical History:  Procedure Laterality Date  . COLONOSCOPY    . dislocated elbow    . 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 Authorizing Provider  aspirin EC 81 MG tablet Take 1 tablet (81 mg total) by mouth 2 (two) times daily. Patient not taking: Reported on 11/20/2018 04/12/18   Allena Katz, PA-C    Family History No family history on file.  Social History Social History   Tobacco Use  . Smoking status:  Former Smoker    Packs/day: 1.00    Years: 37.00    Pack years: 37.00    Types: Cigarettes    Last attempt to quit: 03/15/2018    Years since quitting: 0.6  . Smokeless tobacco: Never Used  Substance Use Topics  . Alcohol use: No    Alcohol/week: 0.0 standard drinks  . Drug use: No     Allergies   Patient has no known allergies.   Review of Systems Review of Systems Ten systems reviewed and are negative for acute change, except as noted in the HPI.    Physical Exam Updated Vital Signs BP 129/84   Pulse 72   Temp 97.9 F (36.6 C)   Resp 13   Ht  (1.702 m)   Wt 88.9 kg   SpO2 97%   BMI 30.70 kg/m   Physical Exam  Physical Exam  Nursing note and vitals reviewed. Constitutional: He appears well-developed and well-nourished. No distress.  HENT:  Head: Normocephalic and atraumatic.  Eyes: Conjunctivae normal are normal. No scleral icterus.  Neck: Normal range of motion. Neck supple.  Cardiovascular: Normal rate, regular rhythm and normal heart sounds.   Pulmonary/Chest: Effort normal and breath sounds normal. No respiratory distress.  Abdominal: Soft. There is no tenderness.  Musculoskeletal: Right lower extremity with abrasions, no evidence of open  fracture.  There is significant swelling and tenderness in middle of the right lower extremity.  Normal DP and PT pulse, cap refill less than 2, compartments are soft.Marland Kitchen He is broken but is pretty stable fracture I think he might be able to get away with like immobilization and nonweightbearing 25 point unclear well he is got a lot of fracture series to his fracture down here fractures appear fractures appear fixation OF he had probably looks like a little unstable through that region right he has relationship with Neurological: He is alert.  Skin: Skin is warm and dry. He is not diaphoretic.  Psychiatric: His behavior is normal.    ED Treatments / Results  Labs (all labs ordered are listed, but only abnormal results  are displayed) Labs Reviewed  BASIC METABOLIC PANEL - Abnormal; Notable for the following components:      Result Value   Calcium 8.8 (*)    Anion gap 4 (*)    All other components within normal limits  CBC WITH DIFFERENTIAL/PLATELET - Abnormal; Notable for the following components:   WBC 17.2 (*)    Neutro Abs 14.5 (*)    Monocytes Absolute 1.1 (*)    All other components within normal limits  HIV ANTIBODY (ROUTINE TESTING W REFLEX)    EKG None  Radiology Dg Knee 1-2 Views Right  Result Date: 11/20/2018 CLINICAL DATA:  Pain.  Preoperative assessment EXAM: RIGHT KNEE - 1-2 VIEW COMPARISON:  Jan 18, 2016 FINDINGS: Frontal and lateral views were obtained. There is a fracture at the junction of the proximal metaphysis and diaphysis of the fibula with alignment near anatomic. No other fracture appreciable in the knee region. No dislocation. There is a focal joint effusion. There is moderate joint space narrowing medially and in the patellofemoral joint. There is spurring in all compartments. No erosive change. IMPRESSION: 1. Fracture proximal fibula with alignment near anatomic. No dislocation in the knee region. 2. joint effusion, fairly small. 3. Osteoarthritic change, most notably involving the medial compartment and patellofemoral joint. There is spurring in all compartments, however. Electronically Signed   By: Bretta Bang III M.D.   On: 11/20/2018 15:22   Dg Tibia/fibula Right  Result Date: 11/20/2018 CLINICAL DATA:  Patient status post ORIF distal tibia fracture. EXAM: DG C-ARM 61-120 MIN; RIGHT TIBIA AND FIBULA - 2 VIEW COMPARISON:  Lower extremity urinary grafts 11/20/2018 FINDINGS: Intramedullary rod and screw fixation of the distal tibia fracture. Improved anatomic alignment. IMPRESSION: Patient status post ORIF distal tibia fracture. Electronically Signed   By: Annia Belt M.D.   On: 11/20/2018 18:58   Dg Tibia/fibula Right  Result Date: 11/20/2018 CLINICAL DATA:  Tree fell  on leg. EXAM: RIGHT TIBIA AND FIBULA - 2 VIEW COMPARISON:  None. FINDINGS: There are 3 fractures of the fibula. There is a nondisplaced fracture through the fibular neck, a nondisplaced transverse fracture through the midshaft and a minimally displaced oblique coursing distal fibular shaft fracture above the level of the ankle mortise. Complex comminuted but minimally displaced fracture of the mid tibial shaft. The knee and ankle joints are maintained. Degenerative changes are noted. IMPRESSION: Tibia and fibular fractures as detailed above. Electronically Signed   By: Rudie Meyer M.D.   On: 11/20/2018 13:26   Dg Ankle Complete Right  Result Date: 11/20/2018 CLINICAL DATA:  Tree fell on leg. EXAM: RIGHT ANKLE - COMPLETE 3+ VIEW COMPARISON:  Tib-fib films from earlier today. FINDINGS: The ankle mortise is maintained. There is an oblique coursing  distal fibular fracture above the level of the ankle mortise. No significant displacement. No distal tibial fracture. The talus is normal. The subtalar joints are maintained. No mid or hindfoot fractures. IMPRESSION: Oblique coursing distal fibular fracture above the level of the ankle mortise. No distal tibial fractures. Mid tibia and fibular fractures as demonstrated on the earlier tib-fib films. Electronically Signed   By: Rudie Meyer M.D.   On: 11/20/2018 15:22   Dg C-arm 1-60 Min  Result Date: 11/20/2018 CLINICAL DATA:  Patient status post ORIF distal tibia fracture. EXAM: DG C-ARM 61-120 MIN; RIGHT TIBIA AND FIBULA - 2 VIEW COMPARISON:  Lower extremity urinary grafts 11/20/2018 FINDINGS: Intramedullary rod and screw fixation of the distal tibia fracture. Improved anatomic alignment. IMPRESSION: Patient status post ORIF distal tibia fracture. Electronically Signed   By: Annia Belt M.D.   On: 11/20/2018 18:58    Procedures Procedures (including critical care time)  Medications Ordered in ED Medications  acetaminophen (TYLENOL) tablet 650 mg ( Oral MAR  Unhold 11/20/18 1955)    Or  acetaminophen (TYLENOL) suppository 650 mg ( Rectal MAR Unhold 11/20/18 1955)  enoxaparin (LOVENOX) injection 40 mg ( Subcutaneous MAR Unhold 11/20/18 1955)  ketorolac (TORADOL) 15 MG/ML injection 15 mg ( Intravenous MAR Unhold 11/20/18 1955)  oxyCODONE (Oxy IR/ROXICODONE) immediate release tablet 5-10 mg ( Oral MAR Unhold 11/20/18 1955)  morphine 2 MG/ML injection 2 mg ( Intravenous MAR Unhold 11/20/18 1955)  methocarbamol (ROBAXIN) tablet 500 mg ( Oral MAR Unhold 11/20/18 1955)    Or  methocarbamol (ROBAXIN) 500 mg in dextrose 5 % 50 mL IVPB ( Intravenous MAR Unhold 11/20/18 1955)  diphenhydrAMINE (BENADRYL) 12.5 MG/5ML elixir 12.5-25 mg ( Oral MAR Unhold 11/20/18 1955)  docusate sodium (COLACE) capsule 100 mg ( Oral MAR Unhold 11/20/18 1955)  ondansetron (ZOFRAN) tablet 4 mg ( Oral MAR Unhold 11/20/18 1955)    Or  ondansetron (ZOFRAN) injection 4 mg ( Intravenous MAR Unhold 11/20/18 1955)  lactated ringers infusion (has no administration in time range)  metoCLOPramide (REGLAN) tablet 5-10 mg (has no administration in time range)    Or  metoCLOPramide (REGLAN) injection 5-10 mg (has no administration in time range)  ceFAZolin (ANCEF) IVPB 2g/100 mL premix (has no administration in time range)  fentaNYL (SUBLIMAZE) 100 MCG/2ML injection (has no administration in time range)  ondansetron (ZOFRAN) injection 4 mg (4 mg Intravenous Given 11/20/18 1238)  morphine 4 MG/ML injection 4 mg (4 mg Intravenous Given 11/20/18 1239)  Tdap (BOOSTRIX) injection 0.5 mL (0.5 mLs Intramuscular Given 11/20/18 1339)  HYDROmorphone (DILAUDID) injection 0.5 mg (0.5 mg Intravenous Given 11/20/18 1405)  ceFAZolin (ANCEF) IVPB 2g/100 mL premix (2 g Intravenous Given 11/20/18 1730)  ceFAZolin (ANCEF) 2-4 GM/100ML-% IVPB (  Override pull for Anesthesia 11/20/18 1730)     Initial Impression / Assessment and Plan / ED Course  I have reviewed the triage vital signs and the nursing notes.  Pertinent  labs & imaging results that were available during my care of the patient were reviewed by me and considered in my medical decision making (see chart for details).        58 y/o male with leg injury. Patient does not appear to have compartment syndrome, as his calf is soft. He is nvi. He has no other sig injuries. The patient will be admitted for IM nail repair by Dr. Susa Simmonds. He is HDS.   Final Clinical Impressions(s) / ED Diagnoses   Final diagnoses:  Closed tibia fracture  Surgery, elective  ED Discharge Orders    None       Arthor Captain, Cordelia Poche 11/20/18 2011    Loren Racer, MD 11/21/18 [phone]

## 2018-11-20 NOTE — ED Triage Notes (Signed)
Per EMS- pt was cutting branches, branch hit right tib fib. Deformity noted.

## 2018-11-20 NOTE — Progress Notes (Signed)
Dr. Susa Simmonds returned RN's call. Ordered a full diet will advise the patient of the same.

## 2018-11-20 NOTE — Op Note (Signed)
Anthony Shelton male 58 y.o. 11/20/2018  PreOperative Diagnosis: Right tibial shaft fracture Right segmental fibula fracture  PostOperative Diagnosis: Same  PROCEDURE: Intramedullary nailing of right tibia fracture Right ankle stress under anesthesia  SURGEON: Dub Mikes, MD  ASSISTANT: None  ANESTHESIA: General  FINDINGS: Right comminuted tibial shaft fracture Right segmental fibula fracture with fractured just above the distal syndesmosis  IMPLANTS: Synthes 10 x 315 suprapatellar nail  INDICATIONS:57 y.o. male was cutting down a tree and the tree fell and pinned his right leg between the tree stump and the falling tree.  He had pain and swelling in his right leg.  He is brought to the emergency department diagnosed with a tibial shaft fracture with segmental fibula fracture.  In the emergency department he did not have any signs of compartment syndrome.  His compartments were soft and he had no pain with passive hallux stretching.  He was indicated for intramedullary nail fixation as well as ankle stress under anesthesia.  We discussed the risk, benefits and alternatives of surgery which include but are not limited to wound healing complications, infection, nonunion, malunion, need for further surgery, continued pain, need for further surgery demonstrating structures.  After weighing these risks he opted proceed.  We also discussed the risk of him being a smoker and the effect on wound healing complications and bone healing.  We also discussed the perioperative and anesthetic risk which include death.  PROCEDURE: Patient was identified in the preoperative holding area.  The right leg was marked by myself.  Consent was signed by myself and the patient.  Anesthesia saw the patient and he was taken to the operative suite.  General anesthesia was induced without difficulty.  He is moved to the operative table in the supine position.  A thigh tourniquet was placed on the right  thigh.  Preoperative antibiotics were given.  A bump was placed under the right hip and all bony prominences were well-padded.  The right lower extremity was prepped and draped in the usual sterile fashion.  A surgical timeout was performed.  We began by making incision just proximal to the patella and the central tear of the distal thigh.  This taken sharply down through skin subcutaneous tissue as well as quadriceps tendon.  This was taken into the knee joint.  There was a rush of hemarthrosis at the time of entry into the knee joint.  This was suctioned out.  The knee joint at the proximal end was inspected and there is found to be some arthritic changes.  Then using the entry trocar the tip of the tibia was identified and using fluoroscopy appropriate position of the guidepin placement was confirmed firmed.  The guidepin was placed proximally in the appropriate position.  It was run into the proximal tibia.  Then the pin collet was removed and the entry reamer was used to enter the proximal tibia.  Then the guidepin was removed.  A bead tip guidewire was bent at the tip and then placed down into the proximal tibia across the fracture site.  We are able to obtain excellent reduction of the fracture site with towels, bumps and gentle traction.  Appropriate reduction of the fracture site was confirmed on fluoroscopy in the AP and lateral planes.  The bead tip guidewire was sent across the fracture site down into the center center position at the distal tibia.  Then the measuring device was used and it measured at 320.  We chose a 315 mm nail.  Then the 8.5 mm end-cutting reamer was used and we sequentially reamed up to 11.5.  Then the nail was placed over the bead tip guidewire down to the appropriate position.  Maintenance of reduction of the fracture site was confirmed on fluoroscopy.  We then placed 2 distal interlocking screws using a perfect circle technique.  Then we gently backslash the fracture to ensure  good bony contact.  There was good bony contact and this was confirmed on fluoroscopy.  We then placed 2 proximal interlocking screws using the guide.  Appropriate screw length was confirmed on fluoroscopy.  All screws had a good bite.  We then irrigated out the knee joint copiously with normal saline.  The tendon and peritenon tissue was closed with a 2-0 PDS suture.  The subcuticular tissue was closed with a 2-0 Monocryl and the skin with staples.  We then turned our attention to the ankle.  Using intraoperative fluoroscopy the ankle was stressed in the in all planes and found to be stable.  The fracture site that was just proximal to the syndesmosis was well reduced and appeared to be out to length.  Given this we decided not to fix the distal fibula fracture.  We then placed soft dressing of Xeroform, 4 x 4's and sterile she cotton on the side of the wound and he was placed in a short leg splint.  The counts were correct at the end of the case.  There were no complications.  He tolerated it well.  He was awakened from anesthesia taken to recovery room in stable condition.  POST OPERATIVE INSTRUCTIONS: Nonweightbearing right lower extremity Keep splint dry Elevate limb on pillows He will mobilize with physical therapy and be discharged once pain controlled and cleared physical therapy He will follow-up with me in 2 weeks for x-rays of the right tibia, ankle and removal of sutures and placement of a walking boot if appropriate. He will be discharged with Lovenox for DVT prophylaxis.  TOURNIQUET TIME: No tourniquet was used  BLOOD LOSS: 150 mL         DRAINS: none         SPECIMEN: none       COMPLICATIONS:  * No complications entered in OR log *         Disposition: PACU - hemodynamically stable.         Condition: stable

## 2018-11-20 NOTE — Anesthesia Procedure Notes (Signed)
Procedure Name: Intubation Date/Time: 11/20/2018 5:21 PM Performed by: Clearnce Sorrel, CRNA Pre-anesthesia Checklist: Patient identified, Emergency Drugs available, Suction available, Patient being monitored and Timeout performed Patient Re-evaluated:Patient Re-evaluated prior to induction Oxygen Delivery Method: Circle system utilized Preoxygenation: Pre-oxygenation with 100% oxygen Induction Type: IV induction Ventilation: Mask ventilation without difficulty Laryngoscope Size: Mac and 3 Grade View: Grade II Tube type: Oral Tube size: 7.5 mm Number of attempts: 1 Airway Equipment and Method: Stylet Placement Confirmation: ETT inserted through vocal cords under direct vision,  positive ETCO2 and breath sounds checked- equal and bilateral Secured at: 23 cm Tube secured with: Tape Dental Injury: Teeth and Oropharynx as per pre-operative assessment

## 2018-11-21 LAB — HIV ANTIBODY (ROUTINE TESTING W REFLEX): HIV Screen 4th Generation wRfx: NONREACTIVE

## 2018-11-21 MED ORDER — ENOXAPARIN SODIUM 40 MG/0.4ML ~~LOC~~ SOLN: 40.0000 mg | SUBCUTANEOUS | 0 refills | Status: AC

## 2018-11-21 MED ORDER — ENOXAPARIN (LOVENOX) PATIENT EDUCATION KIT
PACK | Freq: Once | Status: AC
  Administered 2018-11-21: 10:00:00

## 2018-11-21 MED ORDER — METHOCARBAMOL 500 MG PO TABS: 500.0000 mg | ORAL_TABLET | Freq: Four times a day (QID) | ORAL | 0 refills | Status: AC | PRN

## 2018-11-21 MED ORDER — OXYCODONE HCL 5 MG PO TABS: 5.0000 mg | ORAL_TABLET | ORAL | 0 refills | Status: AC | PRN

## 2018-11-21 MED ORDER — DOCUSATE SODIUM 100 MG PO CAPS: 100.0000 mg | ORAL_CAPSULE | Freq: Two times a day (BID) | ORAL | 0 refills | Status: AC

## 2018-11-21 NOTE — Evaluation (Signed)
Physical Therapy Evaluation Patient Details Name: Anthony Shelton MRN: [id number] DOB: 1961/08/21 Today's Date: 11/21/2018   History of Present Illness  Admitted with R tibial shaft fracture, now s/p IM Nail, NWB;   has a past medical history of Depression, Personal history of tobacco use, presenting hazards to health (06/05/2015), and Spondylosis.  Clinical Impression   Patient evaluated by Physical Therapy with no further acute PT needs identified. All education has been completed and the patient has no further questions. Indepenent prior to admission; Overall using RW to maintain NWN RLE well; OK for dc home from PT standpoint;  See below for any follow-up Physical Therapy or equipment needs. PT is signing off. Thank you for this referral.     Follow Up Recommendations Outpatient PT(The potential need for Outpatient PT can be addressed at Ortho follow-up appointments. )    Equipment Recommendations  None recommended by PT    Recommendations for Other Services       Precautions / Restrictions Restrictions Weight Bearing Restrictions: Yes RLE Weight Bearing: Non weight bearing      Mobility  Bed Mobility Overal bed mobility: Needs Assistance Bed Mobility: Supine to Sit     Supine to sit: Supervision     General bed mobility comments: Cues for technique, but overall safe and not needing physical assist  Transfers Overall transfer level: Needs assistance Equipment used: Rolling walker (2 wheeled) Transfers: Sit to/from Stand Sit to Stand: Min guard;Supervision         General transfer comment: Cues for hand placement and safety from bed; Modified independent with sit <>stand to/form recliner and 3in1  Ambulation/Gait Ambulation/Gait assistance: Min guard;Supervision Gait Distance (Feet): 100 Feet Assistive device: Rolling walker (2 wheeled) Gait Pattern/deviations: (Hop-to)     General Gait Details: Managing NWB very well  Stairs Stairs: Yes Stairs assistance:  Min guard Stair Management: No rails;Step to pattern;Backwards;With walker Number of Stairs: 3(1+2) General stair comments: Step-by-step cues for technqiue; Wife present and provided correct assist; we discussed other options for managing steps as well  Wheelchair Mobility    Modified Rankin (Stroke Patients Only)       Balance                                             Pertinent Vitals/Pain Pain Assessment: No/denies pain    Home Living Family/patient expects to be discharged to:: Private residence Living Arrangements: Spouse/significant other Available Help at Discharge: Family Type of Home: House Home Access: Stairs to enter Entrance Stairs-Rails: None Entrance Stairs-Number of Steps: 2 Home Layout: One [place]: Environmental consultant - 2 wheels;Bedside commode      Prior Function Level of Independence: Independent               Hand Dominance        Extremity/Trunk Assessment   Upper Extremity Assessment Upper Extremity Assessment: Overall WFL for tasks assessed    Lower Extremity Assessment Lower Extremity Assessment: RLE deficits/detail;LLE deficits/detail RLE Deficits / Details: Generally weak, difficulty with straight leg raise, but improved with practice; postivie active toe wiggle; sensation intact to light touch LLE Deficits / Details: PRevious L TKA with some limitations in flexiona nd subjective report of wekness L quad; Still managing well with RW       Communication   Communication: No difficulties  Cognition Arousal/Alertness: Awake/alert Behavior During Therapy: WFL for tasks  assessed/performed Overall Cognitive Status: Within Functional Limits for tasks assessed                                        General Comments General comments (skin integrity, edema, etc.): Discussed postiiotning, car transfers, tips for managing in the bathroom    Exercises     Assessment/Plan    PT Assessment All  further PT needs can be met in the next venue of care  PT Problem List Decreased strength;Decreased range of motion;Decreased activity tolerance;Decreased balance;Decreased mobility;Decreased coordination       PT Treatment Interventions      PT Goals (Current goals can be found in the Care Plan section)  Acute Rehab PT Goals Patient Stated Goal: back to normal PT Goal Formulation: All assessment and education complete, DC therapy    Frequency     Barriers to discharge        Co-evaluation               AM-PAC PT "6 Clicks" Mobility  Outcome Measure Help needed turning from your back to your side while in a flat bed without using bedrails?: None Help needed moving from lying on your back to sitting on the side of a flat bed without using bedrails?: None Help needed moving to and from a bed to a chair (including a wheelchair)?: None Help needed standing up from a chair using your arms (e.g., wheelchair or bedside chair)?: None Help needed to walk in hospital room?: None Help needed climbing 3-5 steps with a railing? : A Little 6 Click Score: 23    End of Session Equipment Utilized During Treatment: Gait belt Activity Tolerance: Patient tolerated treatment well Patient left: in chair;with call bell/phone within reach Nurse Communication: Mobility status PT Visit Diagnosis: Muscle weakness (generalized) (M62.81);Other abnormalities of gait and mobility (R26.89)    Time: 4536-4680 PT Time Calculation (min) (ACUTE ONLY): 36 min   Charges:   PT Evaluation $PT Eval Low Complexity: 1 Low PT Treatments $Gait Training: 8-22 mins        [name], PT  Acute Rehabilitation Services Pager [phone] Office [phone]   [name] 11/21/2018, 10:01 AM

## 2018-11-21 NOTE — Discharge Instructions (Signed)
Intramedullary Nailing of Tibial Diaphyseal Fracture, Care After This sheet gives you information about how to care for yourself after your procedure. Your health care provider may also give you more specific instructions. If you have problems or questions, contact your health care provider. What can I expect after the procedure? After the procedure, it is common to have:  Pain and swelling in your lower leg. Follow these instructions at home: Medicines  Take over-the-counter and prescription medicines only as told by your health care provider.  If you were prescribed an antibiotic medicine, take it as told by your health care provider. Do not stop taking the antibiotic even if you start to feel better.  Do not drive or use heavy machinery while taking prescription medicine or until your health care provider approves. If you have a splint:  Wear the splint as told by your health care provider. Do Not remove dressing  Loosen the splint if your toes tingle, become numb, or turn cold and blue.  Keep the splint clean.  If the splint is not waterproof: ? Do not let it get wet. ? Cover it with a watertight covering when you take a shower. Bathing  Do not take baths, swim, or use a hot tub until your health care provider approves. You may only be allowed to take sponge baths for bathing for the first few days.  Ask your health care provider if you can take showers.  Keep your bandage (dressing) dry until your health care provider says it can be removed. Incision care   Follow instructions from your health care provider about how to take care of your incision. Make sure you: ? Wash your hands with soap and water before you change your bandage (dressing). If soap and water are not available, use hand sanitizer. ? Change your dressing as told by your health care provider. ? Leave stitches (sutures), skin glue, or adhesive strips in place. These skin closures may need to stay in place for  2 weeks or longer. If adhesive strip edges start to loosen and curl up, you may trim the loose edges. Do not remove adhesive strips completely unless your health care provider tells you to do that.  Check your incision area every day for signs of infection. Check for: ? More redness, swelling, or pain. ? More fluid or blood. ? Warmth. ? Pus or a bad smell. Managing pain, stiffness, and swelling   Raise (elevate) the injured area above the level of your heart while you are sitting or lying down.  Move your toes often to avoid stiffness and to lessen swelling.  If directed, put ice on the injured area. ? Put ice in a plastic bag. ? Place a towel between your skin and the bag. ? Leave the ice on for 20 minutes, 2-3 times a day. Activity  Do not use the injured limb to support your body weight. You may be able to put some weight on your leg (partial weight-bearing). Use crutches or a walker. Follow directions as told by your healthcare provider.  Avoid sitting or lying for a long time without moving. You will be encouraged to walk with assistance as soon as you can. This will help prevent blood clots.  If physical therapy was prescribed, do exercises as directed. It is important to do physical therapy to regain muscle strength and range of motion in your leg.  Rest and avoid activities that require a lot of effort or put you at risk for a  fall or another leg injury. Ask your health care provider what activities are safe for you and when you can begin leg motion after surgery. General instructions  To prevent or treat constipation while you are taking prescription pain medicine, your health care provider may recommend that you: ? Drink enough fluid to keep your urine clear or pale yellow. ? Take over-the-counter or prescription medicines. ? Eat foods that are high in fiber, such as fresh fruits and vegetables, whole grains, and beans. ? Limit foods that are high in fat and processed  sugars, such as fried and sweet foods.  Do not use any products that contain nicotine or tobacco, such as cigarettes and e-cigarettes. These can delay bone healing. If you need help quitting, ask your health care provider.  Take steps to prevent falls at home, such as removing throw rugs and tripping hazards.  Keep all follow-up visits as told by your health care provider. This is important. Contact a health care provider if:  You have more redness, swelling, or pain around your incision.  You have more fluid or blood coming from your incision.  Your incision feels warm to the touch.  You have pus or a bad smell coming from your incision.  You have a fever. Get help right away if:  Your incision breaks open.  You have red, painful, or swollen areas in one or both legs.  You have severe pain that does not get better with medicine. Summary  After your procedure, it is common to have pain and swelling in your lower leg.  Check your incision area every day for signs of infection, such as more redness or swelling.  Do not use the injured limb to support your body weight. You may be able to put some weight on your leg (partial weight-bearing). Use crutches or a walker. Follow directions as told by your healthcare provider.  Ask your health care provider what activities are safe for you while you recover. It is important to do physical therapy as instructed to regain strength and range of motion in your leg.  Keep all follow-up visits as told by your health care provider. This is important. This information is not intended to replace advice given to you by your health care provider. Make sure you discuss any questions you have with your health care provider. Document Released: 08/30/2013 Document Revised: 10/29/2016 Document Reviewed: 10/29/2016 Elsevier Interactive Patient Education  2019 [employer].

## 2018-11-21 NOTE — Progress Notes (Signed)
Subjective: 1 Day Post-Op Procedure(s) (LRB): INTRAMEDULLARY (IM) NAIL TIBIAL (Right)  Patient reports some soreness in the leg on the right.  But it is controlled.  He has not been out of bed yet.  He has not worked with physical therapy yet.  Denies any shortness of breath or chest pain.  Objective: Vital signs in last 24 hours: Temp:  [97.5 F (36.4 C)-98.7 F (37.1 C)] 98.7 F (37.1 C) (03/15 0452) Pulse Rate:  [65-88] 79 (03/15 0452) Resp:  [10-23] 13 (03/14 1937) BP: (109-163)/(71-84) 110/73 (03/15 0452) SpO2:  [95 %-100 %] 95 % (03/15 0452) Weight:  [88.9 kg] 88.9 kg (03/14 1219)  Intake/Output from previous day: 03/14 0701 - 03/15 0700 In: 1200 [I.V.:1000; IV Piggyback:200] Out: 1925 [Urine:1800; Blood:125] Intake/Output this shift: Total I/O In: 200 [IV Piggyback:200] Out: 1800 [Urine:1800]  Recent Labs    11/20/18 1237  HGB 13.8   Recent Labs    11/20/18 1237  WBC 17.2*  RBC 4.70  HCT 40.7  PLT 286   Recent Labs    11/20/18 1237  NA 139  K 3.9  CL 111  CO2 24  BUN 15  CREATININE 0.94  GLUCOSE 97  CALCIUM 8.8*   No results for input(s): LABPT, INR in the last 72 hours.  Patient is awake and alert and in no acute distress Respirations even and unlabored. Right lower extremity in a splint and dressing.  He is able to wiggle his toes.  Endorses sensation about the toes.  No pain with passive stretch of the hallux.  No tenderness proximal to the splint.  No significant pain with calf squeeze proximal to the splint.   Assessment/Plan: 1 Day Post-Op Procedure(s) (LRB): INTRAMEDULLARY (IM) NAIL TIBIAL (Right)  We will plan for mobilization with physical therapy today.  He is nonweightbearing on the right lower extremity. He will continue Lovenox for DVT prophylaxis and be discharged with this.  Pain medication has been sent to his pharmacy.  I discussed with him multimodal pain and not relying on narcotic monotherapy. We will see how he does with  physical therapy and he may be suitable for discharge later today. He is completed perioperative antibiotics.  He will follow-up with me in 2 weeks for splint removal, suture removal if appropriate, x-rays and placement of a walking boot.   Terance Hart 11/21/2018, 6:55 AM

## 2018-11-21 NOTE — Progress Notes (Signed)
Pt given discharge instructions and gone over with him and wife, all questions answered. Lovenox pt kit given to pt and instructed him on how to give injections himself. Pt demonstrated proper use of injection, wife is also an Therapist, sports and will be assisting at home. Pt has equipment at home.

## 2018-11-22 ENCOUNTER — Encounter (HOSPITAL_COMMUNITY): Payer: Self-pay | Admitting: Orthopaedic Surgery

## 2018-11-23 NOTE — Discharge Summary (Signed)
Patient ID: Anthony Shelton Hoen MRN: [id number] DOB/AGE: 11-29-1960 58 y.o.  Admit date: 11/20/2018 Discharge date: 11/23/2018  Admission Diagnoses:  Active Problems:   Closed tibia fracture   Discharge Diagnoses:  Same  Past Medical History:  Diagnosis Date  . Depression   . Personal history of tobacco use, presenting hazards to health 06/05/2015  . Spondylosis    Mild, cervical    Surgeries: Procedure(s): INTRAMEDULLARY (IM) NAIL TIBIAL on 11/20/2018 Right   Consultants:   Discharged Condition: Improved  Hospital Course: Anthony Shelton Barella is an 58 y.o. male who was admitted 11/20/2018 for operative treatment of<principal problem not specified>. Patient has severe unremitting pain that affects sleep, daily activities, and work/hobbies. After pre-op clearance the patient was taken to the operating room on 11/20/2018 and underwent  Procedure(s): INTRAMEDULLARY (IM) NAIL TIBIAL.    Postoperative day 1 the patient was able to mobilize well with physical therapy and maintain weightbearing restrictions.  His pain was controlled.  He had no shortness of breath or chest pain.  He was suitable for discharge home.  Patient was given perioperative antibiotics:  Anti-infectives (From admission, onward)   Start     Dose/Rate Route Frequency Ordered Stop   11/21/18 0600  ceFAZolin (ANCEF) IVPB 2g/100 mL premix     2 g 200 mL/hr over 30 Minutes Intravenous On call to O.R. 11/20/18 1615 11/20/18 1730   11/20/18 2330  ceFAZolin (ANCEF) IVPB 2g/100 mL premix     2 g 200 mL/hr over 30 Minutes Intravenous Every 6 hours 11/20/18 1955 11/21/18 1112   11/20/18 1616  ceFAZolin (ANCEF) 2-4 GM/100ML-% IVPB    Note to Pharmacy:  Shireen Quanodd, Robert   : cabinet override      11/20/18 1616 11/20/18 1730       Patient was given sequential compression devices, early ambulation, and chemoprophylaxis to prevent DVT.  Patient benefited maximally from hospital stay and there were no complications.    Recent vital  signs: No data found.   Recent laboratory studies:  Recent Labs    11/20/18 1237  WBC 17.2*  HGB 13.8  HCT 40.7  PLT 286  NA 139  [place] 3.9  CL 111  CO2 24  BUN 15  CREATININE 0.94  GLUCOSE 97  CALCIUM 8.8*     Discharge Medications:   Allergies as of 11/21/2018   No Known Allergies     Medication List    STOP taking these medications   aspirin EC 81 MG tablet     TAKE these medications   docusate sodium 100 MG capsule Commonly known as:  COLACE Take 1 capsule (100 mg total) by mouth 2 (two) times daily for 10 days.   enoxaparin 40 MG/0.4ML injection Commonly known as:  LOVENOX Inject 0.4 mLs (40 mg total) into the skin daily for 14 days.   methocarbamol 500 MG tablet Commonly known as:  ROBAXIN Take 1 tablet (500 mg total) by mouth every 6 (six) hours as needed for up to 30 days for muscle spasms.   oxyCODONE 5 MG immediate release tablet Commonly known as:  Oxy IR/ROXICODONE Take 1-2 tablets (5-10 mg total) by mouth every 4 (four) hours as needed for up to 7 days for breakthrough pain.       Diagnostic Studies: Dg Knee 1-2 Views Right  Result Date: 11/20/2018 CLINICAL DATA:  Pain.  Preoperative assessment EXAM: RIGHT KNEE - 1-2 VIEW COMPARISON:  Jan 18, 2016 FINDINGS: Frontal and lateral views were obtained. There is a fracture  at the junction of the proximal metaphysis and diaphysis of the fibula with alignment near anatomic. No other fracture appreciable in the knee region. No dislocation. There is a focal joint effusion. There is moderate joint space narrowing medially and in the patellofemoral joint. There is spurring in all compartments. No erosive change. IMPRESSION: 1. Fracture proximal fibula with alignment near anatomic. No dislocation in the knee region. 2. joint effusion, fairly small. 3. Osteoarthritic change, most notably involving the medial compartment and patellofemoral joint. There is spurring in all compartments, however. Electronically Signed    By: Bretta Bang III M.D.   On: 11/20/2018 15:22   Dg Tibia/fibula Right  Result Date: 11/20/2018 CLINICAL DATA:  Patient status post ORIF distal tibia fracture. EXAM: DG C-ARM 61-120 MIN; RIGHT TIBIA AND FIBULA - 2 VIEW COMPARISON:  Lower extremity urinary grafts 11/20/2018 FINDINGS: Intramedullary rod and screw fixation of the distal tibia fracture. Improved anatomic alignment. IMPRESSION: Patient status post ORIF distal tibia fracture. Electronically Signed   By: Annia Belt M.D.   On: 11/20/2018 18:58   Dg Tibia/fibula Right  Result Date: 11/20/2018 CLINICAL DATA:  Tree fell on leg. EXAM: RIGHT TIBIA AND FIBULA - 2 VIEW COMPARISON:  None. FINDINGS: There are 3 fractures of the fibula. There is a nondisplaced fracture through the fibular neck, a nondisplaced transverse fracture through the midshaft and a minimally displaced oblique coursing distal fibular shaft fracture above the level of the ankle mortise. Complex comminuted but minimally displaced fracture of the mid tibial shaft. The knee and ankle joints are maintained. Degenerative changes are noted. IMPRESSION: Tibia and fibular fractures as detailed above. Electronically Signed   By: Rudie Meyer M.D.   On: 11/20/2018 13:26   Dg Ankle Complete Right  Result Date: 11/20/2018 CLINICAL DATA:  Tree fell on leg. EXAM: RIGHT ANKLE - COMPLETE 3+ VIEW COMPARISON:  Tib-fib films from earlier today. FINDINGS: The ankle mortise is maintained. There is an oblique coursing distal fibular fracture above the level of the ankle mortise. No significant displacement. No distal tibial fracture. The talus is normal. The subtalar joints are maintained. No mid or hindfoot fractures. IMPRESSION: Oblique coursing distal fibular fracture above the level of the ankle mortise. No distal tibial fractures. Mid tibia and fibular fractures as demonstrated on the earlier tib-fib films. Electronically Signed   By: Rudie Meyer M.D.   On: 11/20/2018 15:22   Dg C-arm  1-60 Min  Result Date: 11/20/2018 CLINICAL DATA:  Patient status post ORIF distal tibia fracture. EXAM: DG C-ARM 61-120 MIN; RIGHT TIBIA AND FIBULA - 2 VIEW COMPARISON:  Lower extremity urinary grafts 11/20/2018 FINDINGS: Intramedullary rod and screw fixation of the distal tibia fracture. Improved anatomic alignment. IMPRESSION: Patient status post ORIF distal tibia fracture. Electronically Signed   By: Annia Belt M.D.   On: 11/20/2018 18:58    Disposition:   Discharge Instructions    Call MD / Call 911   Complete by:  As directed    If you experience chest pain or shortness of breath, CALL 911 and be transported to the hospital emergency room.  If you develope a fever above 101 F, pus (white drainage) or increased drainage or redness at the wound, or calf pain, call your surgeon's office.   Constipation Prevention   Complete by:  As directed    Drink plenty of fluids.  Prune juice may be helpful.  You may use a stool softener, such as Colace (over the counter) 100 mg twice a day.  Use MiraLax (over the counter) for constipation as needed.   Diet - low sodium heart healthy   Complete by:  As directed    Increase activity slowly as tolerated   Complete by:  As directed    NWB RLE      Follow-up Information    Terance Hart, MD. Schedule an appointment as soon as possible for a visit in 2 weeks.   Specialty:  Orthopedic Surgery Contact information: [address] [place] [address] [phone]            Signed: Terance Hart 11/23/2018, 7:18 AM

## 2019-06-09 ENCOUNTER — Telehealth: Payer: Self-pay | Admitting: *Deleted

## 2019-06-09 DIAGNOSIS — Z122 Encounter for screening for malignant neoplasm of respiratory organs: Secondary | ICD-10-CM

## 2019-06-09 DIAGNOSIS — Z87891 Personal history of nicotine dependence: Secondary | ICD-10-CM

## 2019-06-09 NOTE — Telephone Encounter (Signed)
Patient has been notified that annual lung cancer screening low dose CT scan is due currently or will be in near future. Confirmed that patient is within the age range of 55-77, and asymptomatic, (no signs or symptoms of lung cancer). Patient denies illness that would prevent curative treatment for lung cancer if found. Verified smoking history, (current, 38 pack year). The shared decision making visit was done 06/05/15. Patient is agreeable for CT scan being scheduled.

## 2019-06-15 ENCOUNTER — Ambulatory Visit
Admission: RE | Admit: 2019-06-15 | Discharge: 2019-06-15 | Disposition: A | Discharge status: Home / Self Care | Payer: Commercial Managed Care - PPO | Source: Ambulatory Visit | Attending: Oncology | Admitting: Oncology

## 2019-06-15 ENCOUNTER — Other Ambulatory Visit: Payer: Self-pay

## 2019-06-15 DIAGNOSIS — Z87891 Personal history of nicotine dependence: Secondary | ICD-10-CM | POA: Diagnosis present

## 2019-06-15 DIAGNOSIS — Z122 Encounter for screening for malignant neoplasm of respiratory organs: Secondary | ICD-10-CM

## 2019-06-20 ENCOUNTER — Encounter: Payer: Self-pay | Admitting: *Deleted

## 2019-11-25 ENCOUNTER — Ambulatory Visit (INDEPENDENT_AMBULATORY_CARE_PROVIDER_SITE_OTHER): Payer: 59 | Admitting: Otolaryngology

## 2019-11-25 ENCOUNTER — Encounter (INDEPENDENT_AMBULATORY_CARE_PROVIDER_SITE_OTHER): Payer: Self-pay | Admitting: Otolaryngology

## 2019-11-25 ENCOUNTER — Other Ambulatory Visit: Payer: Self-pay

## 2019-11-25 VITALS — Temp 97.9°F

## 2019-11-25 DIAGNOSIS — H6123 Impacted cerumen, bilateral: Secondary | ICD-10-CM | POA: Diagnosis not present

## 2019-11-25 DIAGNOSIS — H6521 Chronic serous otitis media, right ear: Secondary | ICD-10-CM | POA: Diagnosis not present

## 2019-11-25 NOTE — Progress Notes (Signed)
HPI: LEMON STERNBERG is a 59 y.o. male who presents for evaluation of ear complaints.  He has had a chronic history of ear wax buildup and gets his ears cleaned every 6 months at [facility].  More recently developed a right serous otitis media this past October he has been treated with Flonase which he has been using regularly since that time as well as steroids.  The right ear is still not cleared although it is a little bit better.  He is able to pop the ear himself.  Past Medical History:  Diagnosis Date  . Depression   . Personal history of tobacco use, presenting hazards to health 06/05/2015  . Spondylosis    Mild, cervical   Past Surgical History:  Procedure Laterality Date  . COLONOSCOPY    . dislocated elbow    . TIBIA IM NAIL INSERTION Right 11/20/2018   Procedure: INTRAMEDULLARY (IM) NAIL TIBIAL;  Surgeon: Terance Hart, MD;  Location: [facility] OR;  Service: Orthopedics;  Laterality: Right;  . 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 Marital status: Married    Spouse name: Not on file  . Number of children: Not on file  . Years of education: Not on file  . Highest education level: Not on file  Occupational History  . Not on file  Tobacco Use  . Smoking status: Former Smoker    Packs/day: 1.00    Years: 37.00    Pack years: 37.00    Types: Cigarettes    Quit date: 03/15/2018    Years since quitting: 1.6  . Smokeless tobacco: Never Used  Substance and Sexual Activity  . Alcohol use: No    Alcohol/week: 0.0 standard drinks  . Drug use: No  . Sexual activity: Not on file  Other Topics Concern  . Not on file  Social History Narrative  . Not on file   Social Determinants of Health   Financial Resource Strain:   . Difficulty of Paying Living Expenses:   Food Insecurity:   . Worried About  Programme researcher, broadcasting/film/video in the Last Year:   . Barista in the Last Year:   Transportation Needs:   . Freight forwarder (Medical):   Marland Kitchen Lack of Transportation (Non-Medical):   Physical Activity:   . Days of Exercise per Week:   . Minutes of Exercise per Session:   Stress:   . Feeling of Stress :   Social Connections:   . Frequency of Communication with Friends and Family:   . Frequency of Social Gatherings with Friends and Family:   . Attends Religious Services:   . Active Member of Clubs or Organizations:   . Attends Banker Meetings:   Marland Kitchen Marital Status:    No family history on file. No Known Allergies Prior to Admission medications   Medication Sig Start Date End Date Taking? Authorizing Provider  enoxaparin (LOVENOX) 40 MG/0.4ML injection Inject 0.4 mLs (40 mg total) into the skin daily for 14 days. 11/21/18 12/05/18  Terance Hart, MD     Positive ROS: Otherwise negative  All other systems have been reviewed and were otherwise negative with the exception of those mentioned in the HPI and as above.  Physical Exam: Constitutional: Alert, well-appearing, no acute distress Ears: External ears without lesions or  tenderness.  Has a mild amount of wax buildup in both ears that was cleaned in the office using curettes.  On examination of the right TM he has a retracted right TM with a serous otitis media.  He was able to insufflate some air behind the TM himself.  Left TM was clear.  On tuning fork testing Weber lateralized to the right side and BC was great AC on the right side.  Left side AC > BC. Nasal: External nose without lesions. Septum with mild deformity in mild rhinitis..  Nasal endoscopy revealed a clear right nasopharynx and right eustachian tube opening with no obvious obstruction. Oral: Lips and gums without lesions. Tongue and palate mucosa without lesions. Posterior oropharynx clear. Neck: No palpable adenopathy or masses Respiratory: Breathing  comfortably  Skin: No facial/neck lesions or rash noted.  Cerumen impaction removal  Date/Time: 11/25/2019 4:55 PM Performed by: Rozetta Nunnery, MD Authorized by: Rozetta Nunnery, MD   Consent:    Consent obtained:  Verbal   Consent given by:  Patient   Risks discussed:  Pain and bleeding Procedure details:    Location:  L ear and R ear   Procedure type: curette   Post-procedure details:    Inspection:  TM intact and canal normal   Hearing quality:  Improved   Patient tolerance of procedure:  Tolerated well, no immediate complications Comments:     Patient with right serous otitis media.  Left TM was clear.    Assessment: Moderate cerumen buildup Chronic right serous otitis media  Plan: Briefly discussed the option of a myringotomy to help resolve the serous otitis media acutely.  He had been previously offered myringotomy tube but did not want this performed. Recommended continue use of the Flonase 2 sprays each nostril at night and to pop his ear daily. He will follow-up as needed.  Radene Journey, MD

## 2020-06-07 ENCOUNTER — Telehealth: Payer: Self-pay | Admitting: *Deleted

## 2020-06-07 DIAGNOSIS — Z87891 Personal history of nicotine dependence: Secondary | ICD-10-CM

## 2020-06-07 DIAGNOSIS — Z122 Encounter for screening for malignant neoplasm of respiratory organs: Secondary | ICD-10-CM

## 2020-06-07 NOTE — Telephone Encounter (Signed)
Contacted patient in attempt to schedule annual lung screening scan. Patient reports recent covid pneumonia with xray less than 1 month ago showing pneumonia still present. Will call back in the future to assess for timing of lung screening scan.

## 2020-07-16 NOTE — Telephone Encounter (Signed)
Reports he is doing much better from being sick and is ready to schedule his lung screening scan. He is a former smoker, quit 06/22/20, 38 pack year.

## 2020-07-16 NOTE — Addendum Note (Signed)
Addended by: Jonne Ply on: 07/16/2020 11:45 AM   Modules accepted: Orders

## 2020-07-27 ENCOUNTER — Ambulatory Visit
Admission: RE | Admit: 2020-07-27 | Discharge: 2020-07-27 | Disposition: A | Discharge status: Home / Self Care | Payer: 59 | Source: Ambulatory Visit | Attending: Nurse Practitioner | Admitting: Nurse Practitioner

## 2020-07-27 ENCOUNTER — Other Ambulatory Visit: Payer: Self-pay

## 2020-07-27 DIAGNOSIS — Z122 Encounter for screening for malignant neoplasm of respiratory organs: Secondary | ICD-10-CM | POA: Insufficient documentation

## 2020-07-27 DIAGNOSIS — Z87891 Personal history of nicotine dependence: Secondary | ICD-10-CM | POA: Insufficient documentation

## 2020-07-30 ENCOUNTER — Telehealth: Payer: Self-pay | Admitting: *Deleted

## 2020-07-30 NOTE — Telephone Encounter (Signed)
Notified patient of LDCT lung cancer screening program results with recommendation for 12 month follow up imaging. Also notified of incidental findings noted below and is encouraged to discuss further with PCP who will receive a copy of this note and/or the CT report. Patient verbalizes understanding.   IMPRESSION: 1. Lung-RADS 1, negative. Continue annual screening with low-dose chest CT without contrast in 12 months. 2. Emphysema (ICD10-J43.9) and Aortic Atherosclerosis (ICD10-170.0)

## 2021-10-14 DIAGNOSIS — J209 Acute bronchitis, unspecified: Secondary | ICD-10-CM | POA: Diagnosis not present

## 2021-10-14 DIAGNOSIS — B9689 Other specified bacterial agents as the cause of diseases classified elsewhere: Secondary | ICD-10-CM | POA: Diagnosis not present

## 2021-10-14 DIAGNOSIS — J019 Acute sinusitis, unspecified: Secondary | ICD-10-CM | POA: Diagnosis not present

## 2021-10-14 DIAGNOSIS — J029 Acute pharyngitis, unspecified: Secondary | ICD-10-CM | POA: Diagnosis not present

## 2021-11-19 ENCOUNTER — Other Ambulatory Visit: Payer: Self-pay | Admitting: *Deleted

## 2021-11-19 DIAGNOSIS — Z87891 Personal history of nicotine dependence: Secondary | ICD-10-CM

## 2021-11-19 DIAGNOSIS — F17210 Nicotine dependence, cigarettes, uncomplicated: Secondary | ICD-10-CM

## 2021-11-20 ENCOUNTER — Ambulatory Visit
Admission: RE | Admit: 2021-11-20 | Discharge: 2021-11-20 | Disposition: A | Discharge status: Home / Self Care | Payer: 59 | Source: Ambulatory Visit | Attending: Acute Care | Admitting: Acute Care

## 2021-11-20 DIAGNOSIS — I700 Atherosclerosis of aorta: Secondary | ICD-10-CM | POA: Diagnosis not present

## 2021-11-20 DIAGNOSIS — F17210 Nicotine dependence, cigarettes, uncomplicated: Secondary | ICD-10-CM

## 2021-11-20 DIAGNOSIS — J432 Centrilobular emphysema: Secondary | ICD-10-CM | POA: Diagnosis not present

## 2021-11-20 DIAGNOSIS — Z87891 Personal history of nicotine dependence: Secondary | ICD-10-CM

## 2021-11-22 ENCOUNTER — Other Ambulatory Visit: Payer: Self-pay | Admitting: Acute Care

## 2021-11-22 DIAGNOSIS — Z87891 Personal history of nicotine dependence: Secondary | ICD-10-CM

## 2022-04-30 DIAGNOSIS — Z125 Encounter for screening for malignant neoplasm of prostate: Secondary | ICD-10-CM | POA: Diagnosis not present

## 2022-04-30 DIAGNOSIS — Z0000 Encounter for general adult medical examination without abnormal findings: Secondary | ICD-10-CM | POA: Diagnosis not present

## 2022-04-30 DIAGNOSIS — I700 Atherosclerosis of aorta: Secondary | ICD-10-CM | POA: Diagnosis not present

## 2022-05-07 DIAGNOSIS — Z0000 Encounter for general adult medical examination without abnormal findings: Secondary | ICD-10-CM | POA: Diagnosis not present

## 2022-05-07 DIAGNOSIS — M1990 Unspecified osteoarthritis, unspecified site: Secondary | ICD-10-CM | POA: Diagnosis not present

## 2022-05-07 DIAGNOSIS — Z125 Encounter for screening for malignant neoplasm of prostate: Secondary | ICD-10-CM | POA: Diagnosis not present

## 2022-05-07 DIAGNOSIS — I700 Atherosclerosis of aorta: Secondary | ICD-10-CM | POA: Diagnosis not present

## 2022-05-07 DIAGNOSIS — E785 Hyperlipidemia, unspecified: Secondary | ICD-10-CM | POA: Diagnosis not present

## 2022-05-07 DIAGNOSIS — Z7982 Long term (current) use of aspirin: Secondary | ICD-10-CM | POA: Diagnosis not present

## 2022-11-21 ENCOUNTER — Ambulatory Visit
Admission: RE | Admit: 2022-11-21 | Discharge: 2022-11-21 | Disposition: A | Discharge status: Home / Self Care | Payer: 59 | Source: Ambulatory Visit | Attending: Acute Care | Admitting: Acute Care

## 2022-11-21 ENCOUNTER — Inpatient Hospital Stay: Admission: RE | Admit: 2022-11-21 | Payer: 59 | Source: Ambulatory Visit

## 2022-11-21 DIAGNOSIS — Z87891 Personal history of nicotine dependence: Secondary | ICD-10-CM

## 2022-11-24 ENCOUNTER — Telehealth: Payer: Self-pay | Admitting: Acute Care

## 2022-11-24 NOTE — Telephone Encounter (Signed)
Call Report  

## 2022-11-24 NOTE — Telephone Encounter (Signed)
Received call from [facility] for a call report on patient's lung cancer screening CT. Below is a copy of the impression:   IMPRESSION: 1. New solid pulmonary nodule of the right upper lobe measuring 7.5 mm. Lung-RADS 4A, suspicious. Follow up low-dose chest CT without contrast in 3 months (please use the following order, CT CHEST LCS NODULE FOLLOW-UP W/O CM) is recommended. Alternatively, PET may be considered when there is a solid component 55mm or larger. 2. Aortic Atherosclerosis (ICD10-I70.0) and Emphysema (ICD10-J43.9).   These results will be called to the ordering clinician or representative by the Radiologist Assistant, and communication documented in the PACS or [employer].  Will route to Judson Roch and the lung cancer screening pool for follow up.

## 2022-11-25 ENCOUNTER — Telehealth: Payer: Self-pay | Admitting: Acute Care

## 2022-11-25 DIAGNOSIS — R911 Solitary pulmonary nodule: Secondary | ICD-10-CM

## 2022-11-25 DIAGNOSIS — Z87891 Personal history of nicotine dependence: Secondary | ICD-10-CM

## 2022-11-25 NOTE — Telephone Encounter (Signed)
Results/ plans faxed to PCP. Order placed for 3 mth nodule follow up Ct.

## 2022-11-25 NOTE — Telephone Encounter (Signed)
See telephone note from 11/25/22

## 2022-11-25 NOTE — Telephone Encounter (Signed)
I have called the patient with the results of his low dose Ct Chest. I explained that her has a 7.5 mm Nodule in the right upper lobe that is new, and was not seen on previous imaging 11/2021. I explained that we will do a 3 month follow up to re-evaluate this nodule for stability 02/2023. He is in agreement with this plan. I explained that he will get a call to get the scan scheduled closer to the time. He verbalized understanding of the above. Langley Gauss, I also told him he has moderate emphysema and mild atherosclerotic disease. He is not on statins.

## 2023-02-18 ENCOUNTER — Telehealth: Payer: Self-pay | Admitting: Acute Care

## 2023-02-18 ENCOUNTER — Encounter: Payer: Self-pay | Admitting: Acute Care

## 2023-02-18 NOTE — Telephone Encounter (Signed)
Pt states his insurance has asked for more info as to why pt needs another CT

## 2023-02-19 ENCOUNTER — Encounter: Payer: Self-pay | Admitting: Acute Care

## 2023-02-19 ENCOUNTER — Telehealth: Payer: Self-pay | Admitting: Acute Care

## 2023-02-19 NOTE — Telephone Encounter (Signed)
PT calling again on this issue. Please call before Monday procedure @ [phone]

## 2023-02-20 NOTE — Telephone Encounter (Signed)
Called and left message for pt that we are working with his insurance company regarding the authorization for his CT scan and we will update him once we have their decision.

## 2023-02-23 ENCOUNTER — Inpatient Hospital Stay: Admission: RE | Admit: 2023-02-23 | Payer: 59 | Source: Ambulatory Visit

## 2023-02-23 ENCOUNTER — Other Ambulatory Visit: Payer: 59

## 2023-02-23 NOTE — Telephone Encounter (Signed)
Called and spoke with patient and advised that we have submitted more records to his insurance company to get approval for the follow up CT. We are still waiting on their response. I advised pt that I will update him once we have their decision. Pt verbalized understanding.

## 2023-02-23 NOTE — Telephone Encounter (Signed)
See telephone note from 02/18/23 

## 2023-02-24 NOTE — Telephone Encounter (Signed)
Returned call to patient from VM inquiring about any updates from insurance.  Patient is very concern about getting his repeat LDCT completed.  Requested update from [facility] who states the request is under review by the medical director and has a 3-5 day turnaround for reply.  Advised patient we will let him know as soon as we receive the update. He states he will probably call every day since his family is so worried about his suspicious lung nodule. Acknowledged patient's concern and ok to call us for check in, as he feels is needed.

## 2023-02-25 NOTE — Telephone Encounter (Signed)
Spoke with pt. Authorization for CT has been received. CT scheduled 02/26/23 2:30 at [facility]. Pt verbalized understanding.

## 2023-02-26 ENCOUNTER — Ambulatory Visit
Admission: RE | Admit: 2023-02-26 | Discharge: 2023-02-26 | Disposition: A | Discharge status: Home / Self Care | Payer: 59 | Source: Ambulatory Visit | Attending: Acute Care | Admitting: Acute Care

## 2023-02-26 DIAGNOSIS — R911 Solitary pulmonary nodule: Secondary | ICD-10-CM

## 2023-02-26 DIAGNOSIS — Z87891 Personal history of nicotine dependence: Secondary | ICD-10-CM

## 2023-03-05 ENCOUNTER — Telehealth: Payer: Self-pay | Admitting: Acute Care

## 2023-03-05 ENCOUNTER — Other Ambulatory Visit: Payer: Self-pay

## 2023-03-05 DIAGNOSIS — Z122 Encounter for screening for malignant neoplasm of respiratory organs: Secondary | ICD-10-CM

## 2023-03-05 DIAGNOSIS — Z87891 Personal history of nicotine dependence: Secondary | ICD-10-CM

## 2023-03-05 NOTE — Telephone Encounter (Signed)
I have called the patient with the results of his low dose Ct Chest. This was a 3 month follow up for a 4A scan in March. I explained that the nodule of concern had resolved. The scan was read as a LR 1, 12 month follow up.  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 We discussed that this is a progressive scarring of the lungs with several different etiologies. I recommended a referral to see Dr. Marchelle Gearing  or Dr. Isaiah Serge as they are specialists in this disease state. He is in agreement with this plan. I will place the referral.  From a lung cancer perspective, 12 month follow up 02/2024. Please fax results to PCP and let them know results. Thanks so much.  Pt. Has been sick for several days. I told him to follow up with his PCP if he does not start feeling better soon, or if he gets worse.He verbalized understanding.

## 2023-03-05 NOTE — Telephone Encounter (Signed)
Order placed for annual LDCT 2025 and results/plan faxed to PCP

## 2023-03-06 ENCOUNTER — Other Ambulatory Visit: Payer: Self-pay | Admitting: Acute Care

## 2023-03-06 DIAGNOSIS — J849 Interstitial pulmonary disease, unspecified: Secondary | ICD-10-CM

## 2023-03-10 NOTE — Telephone Encounter (Signed)
Patient's wife called office and left VM stating the patient does not remember the call Maralyn Sago had made on 6/27 because he was sick then. Called patient and pt's wife, [name]. Reiterated the information Maralyn Sago had posted in this message. Gave additional information regarding ILD. [name] was frustrated that the information was not listed in MyChart. I then verbalized verbatim what Maralyn Sago had documented and questioned if [name] would like a call back from Maralyn Sago, she declined. Reassured the scan was good news and a 12 month repeat scan has been ordered and they will be contacted at a later date to get it scheduled. [name] verbalized understanding and denied any additional questions.

## 2023-03-18 ENCOUNTER — Other Ambulatory Visit: Payer: Self-pay | Admitting: Internal Medicine

## 2023-03-18 DIAGNOSIS — J849 Interstitial pulmonary disease, unspecified: Secondary | ICD-10-CM

## 2023-03-25 ENCOUNTER — Encounter: Payer: Self-pay | Admitting: Pulmonary Disease

## 2023-03-25 ENCOUNTER — Other Ambulatory Visit: Payer: Self-pay | Admitting: Pulmonary Disease

## 2023-03-25 DIAGNOSIS — R059 Cough, unspecified: Secondary | ICD-10-CM

## 2023-03-25 DIAGNOSIS — R0602 Shortness of breath: Secondary | ICD-10-CM

## 2023-03-26 ENCOUNTER — Ambulatory Visit
Admission: RE | Admit: 2023-03-26 | Discharge: 2023-03-26 | Disposition: A | Discharge status: Home / Self Care | Payer: 59 | Source: Ambulatory Visit | Attending: Pulmonary Disease | Admitting: Pulmonary Disease

## 2023-03-26 DIAGNOSIS — R059 Cough, unspecified: Secondary | ICD-10-CM

## 2023-03-26 DIAGNOSIS — R0602 Shortness of breath: Secondary | ICD-10-CM

## 2023-03-26 MED ORDER — IOPAMIDOL (ISOVUE-370) INJECTION 76%: 75.0000 mL | Freq: Once | INTRAVENOUS | Status: DC | PRN

## 2023-03-26 MED ORDER — IOPAMIDOL (ISOVUE-370) INJECTION 76%
75.0000 mL | Freq: Once | INTRAVENOUS | Status: AC | PRN
  Administered 2023-03-26: 75 mL via INTRAVENOUS

## 2023-04-20 ENCOUNTER — Institutional Professional Consult (permissible substitution): Payer: 59 | Admitting: Internal Medicine

## 2023-07-29 ENCOUNTER — Other Ambulatory Visit: Payer: Self-pay | Admitting: Internal Medicine

## 2023-07-29 DIAGNOSIS — J849 Interstitial pulmonary disease, unspecified: Secondary | ICD-10-CM

## 2023-07-31 ENCOUNTER — Ambulatory Visit
Admission: RE | Admit: 2023-07-31 | Discharge: 2023-07-31 | Disposition: A | Discharge status: Home / Self Care | Payer: 59 | Source: Ambulatory Visit | Attending: Internal Medicine | Admitting: Internal Medicine

## 2023-07-31 ENCOUNTER — Inpatient Hospital Stay: Admission: RE | Admit: 2023-07-31 | Payer: 59 | Source: Ambulatory Visit

## 2023-07-31 DIAGNOSIS — J849 Interstitial pulmonary disease, unspecified: Secondary | ICD-10-CM

## 2023-09-10 IMAGING — CT CT CHEST LUNG CANCER SCREENING LOW DOSE W/O CM
1 of 2 series · 15 of 33 positions shown, 19 images · non-contrast
Comparison: 07/17/2020

CLINICAL DATA: Lung cancer screening. Thirty-eight pack-year
history. Former asymptomatic smoker.



[Series 4: chest 1mm/super d · axial · 0.69mm/px · z∈[-304,-40]mm · 15 of 322 slices shown, 19 images]
[im 14/322  mediastinal]
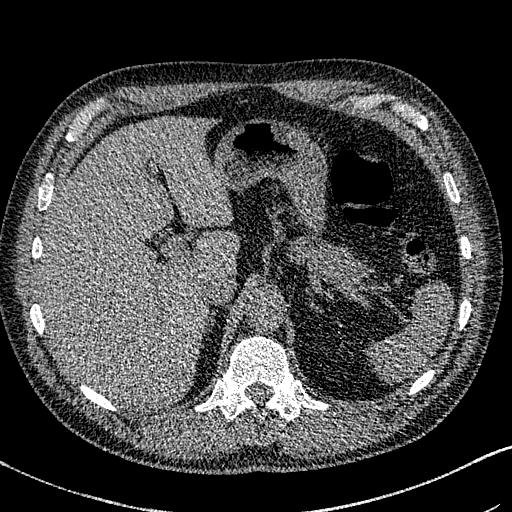
[im 14/322  lung]
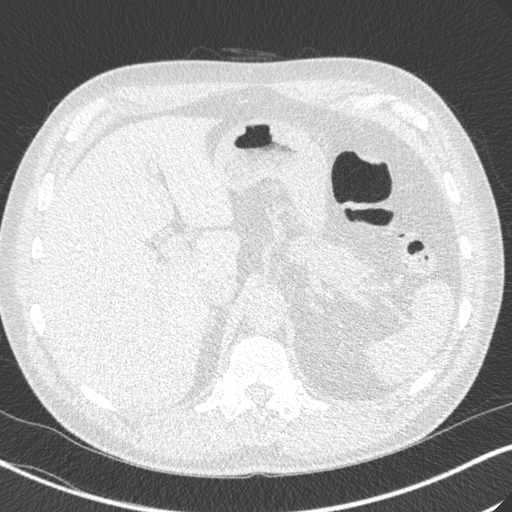
[im 42/322  lung]
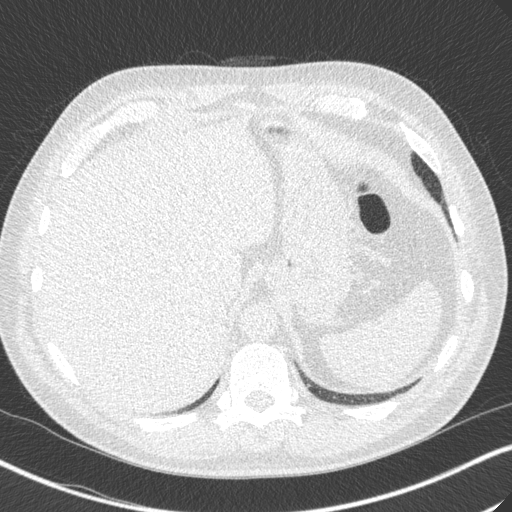
[im 70/322  lung]
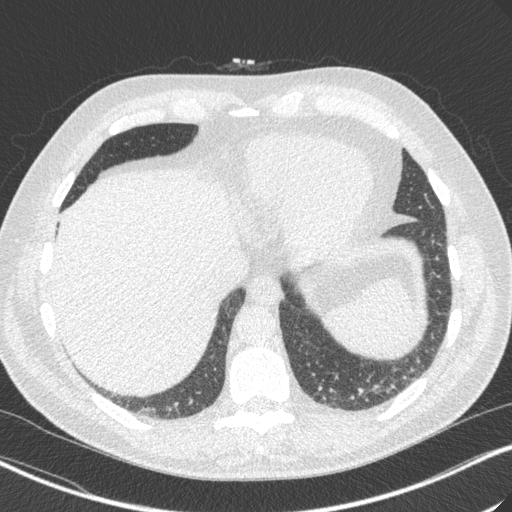
[im 84/322  lung]
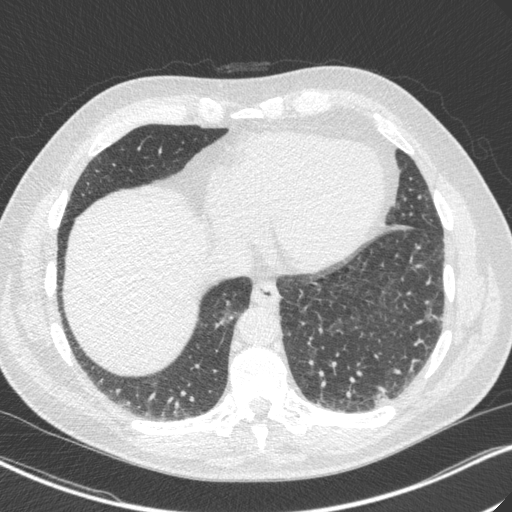
[im 98/322  mediastinal]
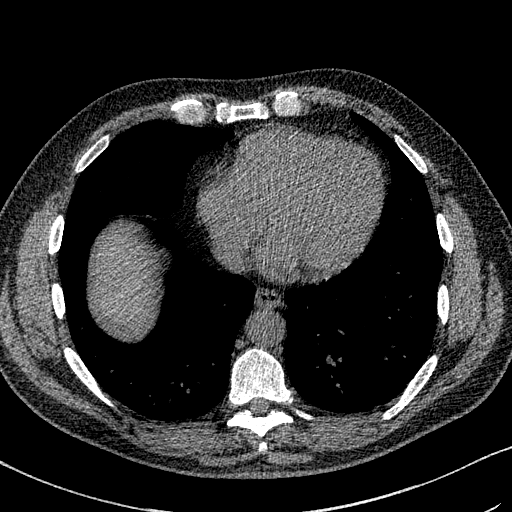
[im 98/322  lung]
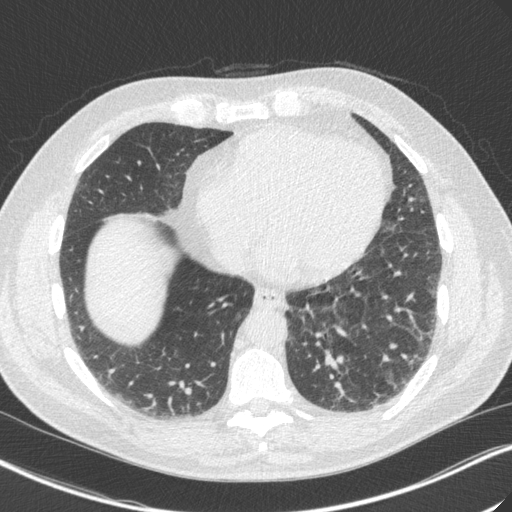
[im 126/322  lung]
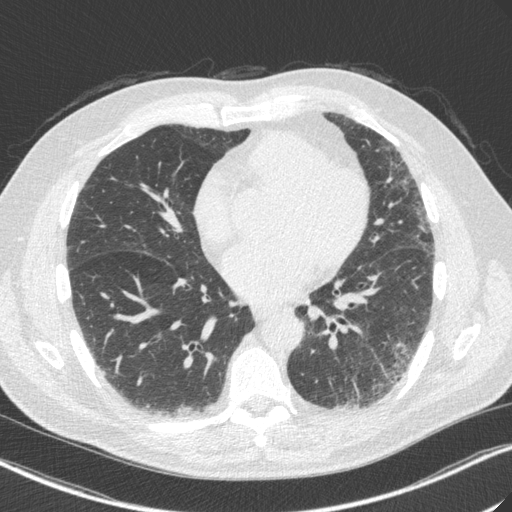
[im 151/322  lung]
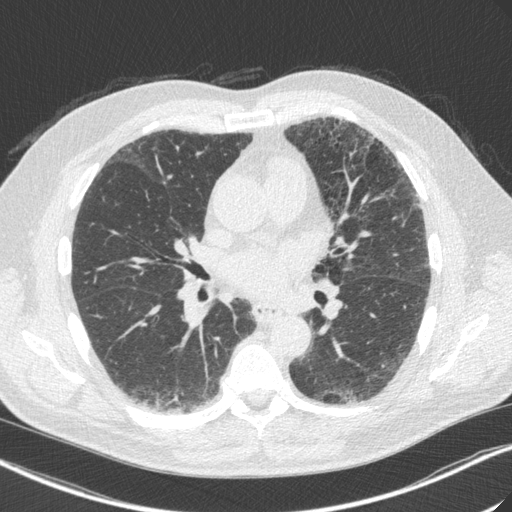
[im 161/322  lung]
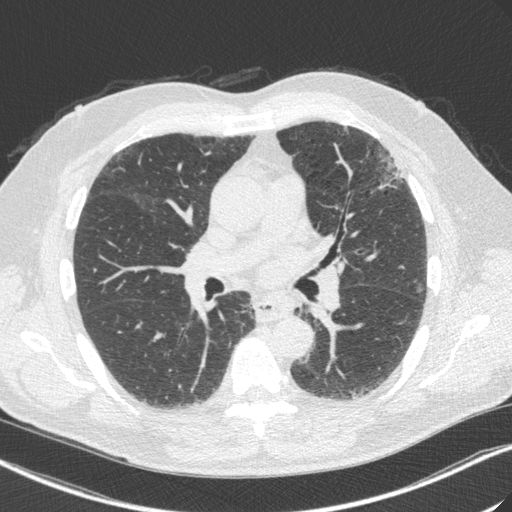
[im 168/322  mediastinal]
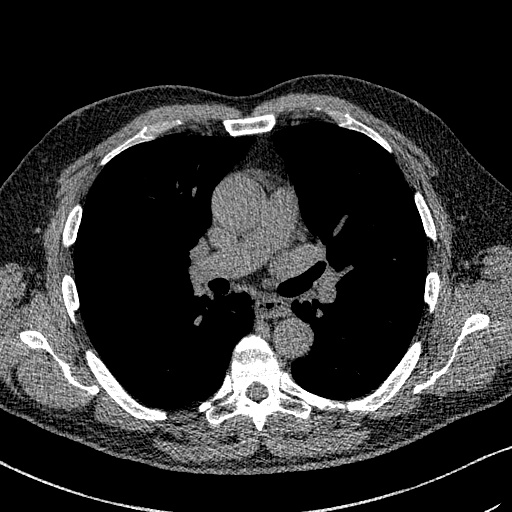
[im 168/322  lung]
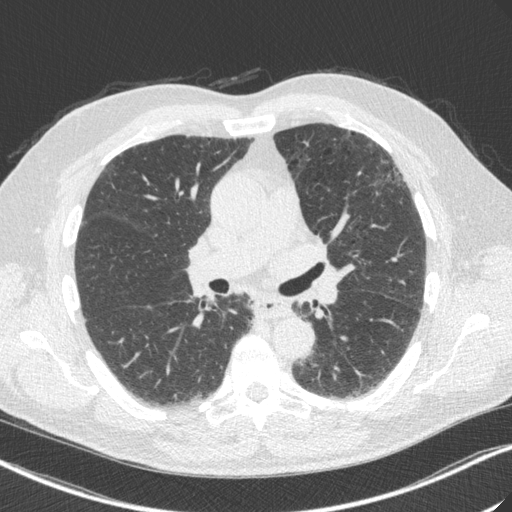
[im 196/322  lung]
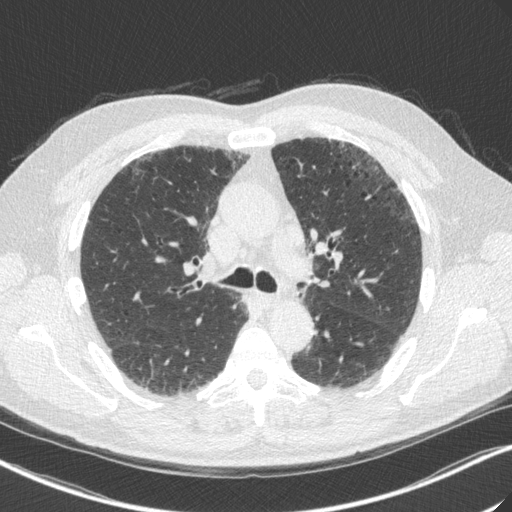
[im 224/322  lung]
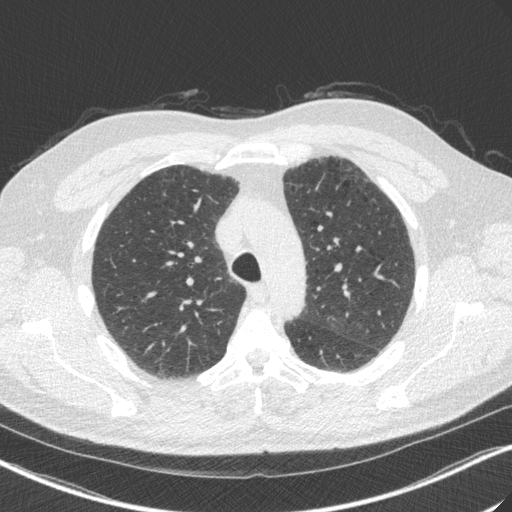
[im 238/322  lung]
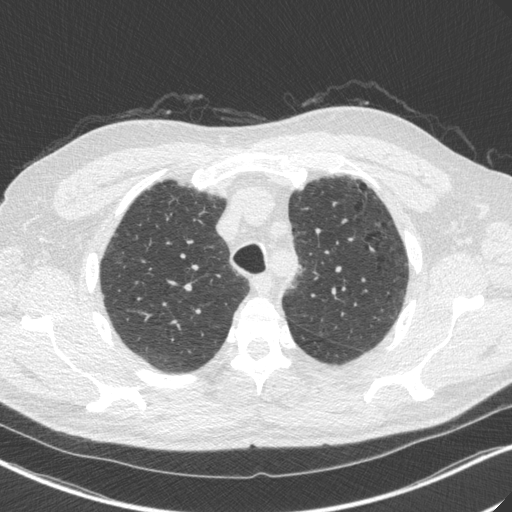
[im 252/322  mediastinal]
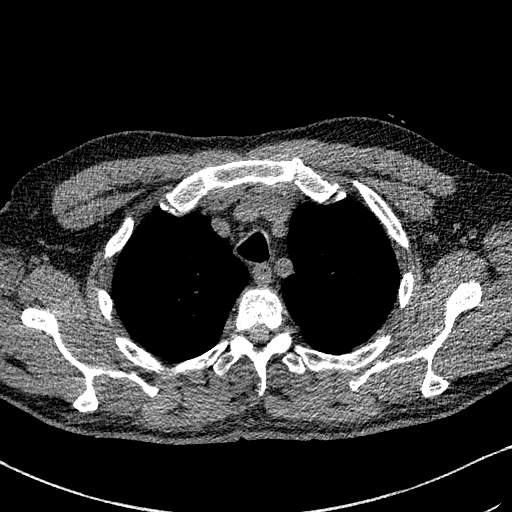
[im 252/322  lung]
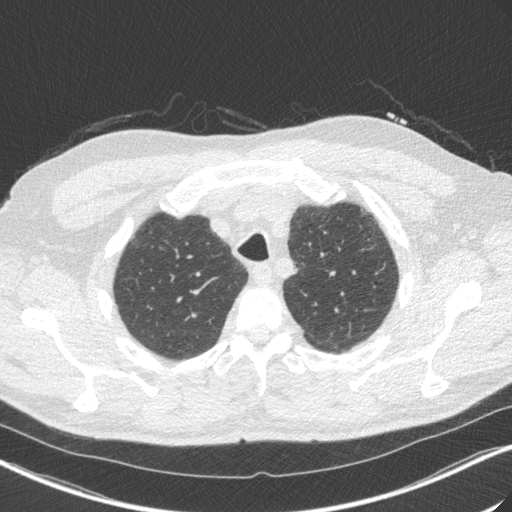
[im 280/322  lung]
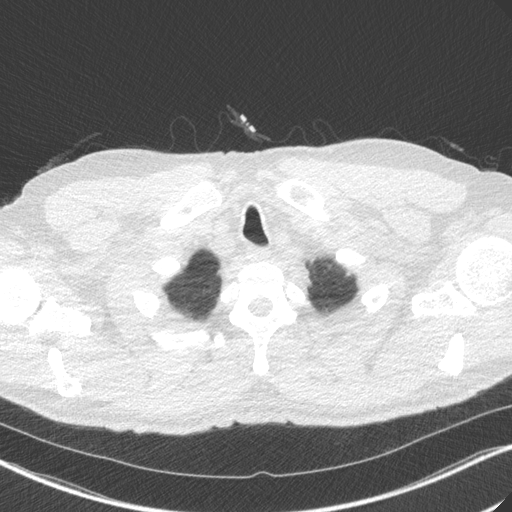
[im 308/322  lung]
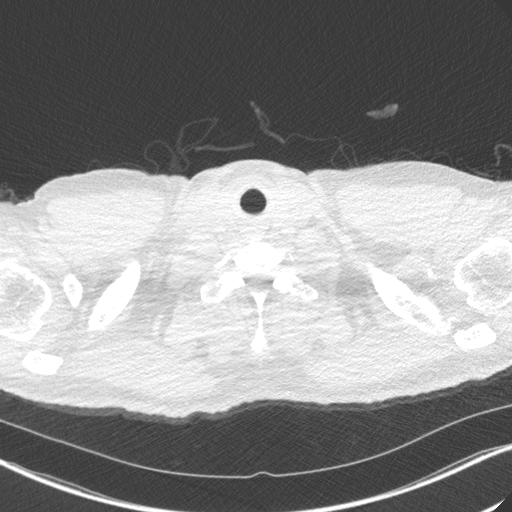

[15 of 33 positions shown; findings below may reference images not displayed]

FINDINGS: Cardiovascular: Normal heart size. Mild aortic atherosclerosis. No
pericardial effusion.

Mediastinum/Nodes: No enlarged mediastinal, hilar, or axillary lymph
nodes. Thyroid gland, trachea, and esophagus demonstrate no
significant findings.

Lungs/Pleura: Centrilobular and paraseptal emphysema identified.
Bilateral peripheral and lower lung zone predominant interstitial
reticulation. No suspicious lung nodules identified.

Upper Abdomen: No acute abnormality.

Musculoskeletal: No chest wall mass or suspicious bone lesions
identified.
IMPRESSION: 1. Lung-RADS 1, negative. Continue annual screening with low-dose
chest CT without contrast in 12 months.
2. Aortic Atherosclerosis (TG5Z6-F8X.X) and Emphysema (TG5Z6-R1X.T).

## 2023-09-23 ENCOUNTER — Other Ambulatory Visit: Payer: Self-pay | Admitting: Pulmonary Disease

## 2023-09-23 DIAGNOSIS — J849 Interstitial pulmonary disease, unspecified: Secondary | ICD-10-CM

## 2023-10-27 ENCOUNTER — Ambulatory Visit
Admission: RE | Admit: 2023-10-27 | Discharge: 2023-10-27 | Disposition: A | Discharge status: Home / Self Care | Payer: 59 | Source: Ambulatory Visit | Attending: Pulmonary Disease | Admitting: Pulmonary Disease

## 2023-10-27 DIAGNOSIS — J849 Interstitial pulmonary disease, unspecified: Secondary | ICD-10-CM

## 2023-11-06 ENCOUNTER — Encounter
Admission: RE | Disposition: A | Discharge status: Home / Self Care | Payer: Self-pay | Source: Home / Self Care | Attending: Gastroenterology

## 2023-11-06 ENCOUNTER — Ambulatory Visit: Payer: 59 | Admitting: Anesthesiology

## 2023-11-06 ENCOUNTER — Other Ambulatory Visit: Payer: Self-pay

## 2023-11-06 ENCOUNTER — Ambulatory Visit
Admission: RE | Admit: 2023-11-06 | Discharge: 2023-11-06 | Disposition: A | Discharge status: Home / Self Care | Payer: 59 | Attending: Gastroenterology | Admitting: Gastroenterology

## 2023-11-06 ENCOUNTER — Encounter: Payer: Self-pay | Admitting: *Deleted

## 2023-11-06 DIAGNOSIS — Z87891 Personal history of nicotine dependence: Secondary | ICD-10-CM | POA: Diagnosis not present

## 2023-11-06 DIAGNOSIS — K2100 Gastro-esophageal reflux disease with esophagitis, without bleeding: Secondary | ICD-10-CM | POA: Diagnosis not present

## 2023-11-06 DIAGNOSIS — K640 First degree hemorrhoids: Secondary | ICD-10-CM | POA: Diagnosis not present

## 2023-11-06 DIAGNOSIS — D124 Benign neoplasm of descending colon: Secondary | ICD-10-CM | POA: Insufficient documentation

## 2023-11-06 DIAGNOSIS — D123 Benign neoplasm of transverse colon: Secondary | ICD-10-CM | POA: Insufficient documentation

## 2023-11-06 DIAGNOSIS — J8410 Pulmonary fibrosis, unspecified: Secondary | ICD-10-CM | POA: Insufficient documentation

## 2023-11-06 DIAGNOSIS — K449 Diaphragmatic hernia without obstruction or gangrene: Secondary | ICD-10-CM | POA: Insufficient documentation

## 2023-11-06 DIAGNOSIS — R490 Dysphonia: Secondary | ICD-10-CM | POA: Diagnosis not present

## 2023-11-06 DIAGNOSIS — Z1211 Encounter for screening for malignant neoplasm of colon: Secondary | ICD-10-CM | POA: Diagnosis present

## 2023-11-06 SURGERY — COLONOSCOPY WITH PROPOFOL
Anesthesia: General

## 2023-11-06 MED ORDER — LIDOCAINE HCL (PF) 2 % IJ SOLN
INTRAMUSCULAR | Status: AC
  Filled 2023-11-06: qty 5

## 2023-11-06 MED ORDER — PROPOFOL 10 MG/ML IV BOLUS
INTRAVENOUS | Status: DC | PRN
  Administered 2023-11-06: 120 ug/kg/min via INTRAVENOUS
  Administered 2023-11-06: 110 mg via INTRAVENOUS

## 2023-11-06 MED ORDER — SODIUM CHLORIDE 0.9 % IV SOLN: INTRAVENOUS | Status: DC

## 2023-11-06 MED ORDER — STERILE WATER FOR IRRIGATION IR SOLN
Status: DC | PRN
  Administered 2023-11-06: 60 mL

## 2023-11-06 MED ORDER — LIDOCAINE HCL (PF) 2 % IJ SOLN
INTRAMUSCULAR | Status: DC | PRN
  Administered 2023-11-06: 100 mg via INTRADERMAL

## 2023-11-06 NOTE — Op Note (Addendum)
 [facility] Patient Name: Anthony Shelton Procedure Date: 11/06/2023 8:59 AM MRN: [id number] Account #: [id number] Date of Birth: 04-09-1961 Admit Type: Outpatient Age: 63 Room: [facility] ENDO ROOM 3 Gender: Male Note Status: Supervisor Override Instrument Name: Patton Salles Endoscope [id number] Procedure:             Upper GI endoscopy Indications:           Hoarseness of Voice Providers:             Eather Colas MD, MD Referring MD:          No Local Md, MD (Referring MD) Medicines:             Monitored Anesthesia Care Complications:         No immediate complications. Procedure:             Pre-Anesthesia Assessment:                        - Prior to the procedure, a History and Physical was                         performed, and patient medications and allergies were                         reviewed. The patient is competent. The risks and                         benefits of the procedure and the sedation options and                         risks were discussed with the patient. All questions                         were answered and informed consent was obtained.                         Patient identification and proposed procedure were                         verified by the physician, the nurse, the                         anesthesiologist, the anesthetist and the technician                         in the endoscopy suite. Mental Status Examination:                         alert and oriented. Airway Examination: normal                         oropharyngeal airway and neck mobility. Respiratory                         Examination: clear to auscultation. CV Examination:                         normal. Prophylactic Antibiotics: The patient does not  require prophylactic antibiotics. Prior                         Anticoagulants: The patient has taken no anticoagulant                         or antiplatelet agents. ASA Grade Assessment:  III - A                         patient with severe systemic disease. After reviewing                         the risks and benefits, the patient was deemed in                         satisfactory condition to undergo the procedure. The                         anesthesia plan was to use monitored anesthesia care                         (MAC). Immediately prior to administration of                         medications, the patient was re-assessed for adequacy                         to receive sedatives. The heart rate, respiratory                         rate, oxygen saturations, blood pressure, adequacy of                         pulmonary ventilation, and response to care were                         monitored throughout the procedure. The physical                         status of the patient was re-assessed after the                         procedure.                        After obtaining informed consent, the endoscope was                         passed under direct vision. Throughout the procedure,                         the patient's blood pressure, pulse, and oxygen                         saturations were monitored continuously. The                         Endosonoscope was introduced through the mouth, and  advanced to the second part of duodenum. The upper GI                         endoscopy was accomplished without difficulty. The                         patient tolerated the procedure well. Findings:      LA Grade B (one or more mucosal breaks greater than 5 mm, not extending       between the tops of two mucosal folds) esophagitis with no bleeding was       found.      A small hiatal hernia was present.      The entire examined stomach was normal.      The examined duodenum was normal. Impression:            - LA Grade B reflux esophagitis with no bleeding.                        - Small hiatal hernia.                        - Normal stomach.                         - Normal examined duodenum.                        - No specimens collected. Recommendation:        - Discharge patient to home.                        - Resume previous diet.                        - Continue present medications.                        - Use a proton pump inhibitor PO daily.                        - Return to referring physician as previously                         scheduled. 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 All rights reserved. The codes documented in this report are preliminary and upon coder review may  be revised to meet current compliance requirements. Eather Colas MD, MD 11/06/2023 9:39:44 AM Number of Addenda: 0 Note Initiated On: 11/06/2023 8:59 AM Estimated Blood Loss:  Estimated blood loss: none.      [facility]

## 2023-11-06 NOTE — H&P (Signed)
 Outpatient short stay form Pre-procedure 11/06/2023  Regis Bill, MD  Primary Physician: Lynnea Ferrier, MD  Reason for visit:  Hoarseness/Cough/Surveillance  History of present illness:    63 y/o gentleman with history of ILD here for EGD/Colonoscopy for hoarseness and surveillance colonoscopy for history of polyps on last colonoscopy in 2019. No blood thinners. No family history of GI malignancies. No significant abdominal surgeries.    Current Facility-Administered Medications:    0.9 %  sodium chloride infusion, , Intravenous, Continuous, Gloria Lambertson, Rossie Muskrat, MD, Last Rate: 20 mL/hr at 11/06/23 0831, Continued from Pre-op at 11/06/23 0831  Medications Prior to Admission  Medication Sig Dispense Refill Last Dose/Taking   enoxaparin (LOVENOX) 40 MG/0.4ML injection Inject 0.4 mLs (40 mg total) into the skin daily for 14 days. (Patient not taking: Reported on 11/06/2023) 14 Syringe 0 Not Taking     No Known Allergies   Past Medical History:  Diagnosis Date   Depression    Personal history of tobacco use, presenting hazards to health 06/05/2015   Spondylosis    Mild, cervical    Review of systems:  Otherwise negative.    Physical Exam  Gen: Alert, oriented. Appears stated age.  HEENT: PERRLA. Lungs: No respiratory distress CV: RRR Abd: soft, benign, no masses Ext: No edema    Planned procedures: Proceed with EGD/colonoscopy. The patient understands the nature of the planned procedure, indications, risks, alternatives and potential complications including but not limited to bleeding, infection, perforation, damage to internal organs and possible oversedation/side effects from anesthesia. The patient agrees and gives consent to proceed.  Please refer to procedure notes for findings, recommendations and patient disposition/instructions.     Regis Bill, MD [facility]

## 2023-11-06 NOTE — Anesthesia Preprocedure Evaluation (Signed)
 Anesthesia Evaluation  Patient identified by MRN, date of birth, ID band Patient awake    Reviewed: Allergy & Precautions, NPO status , Patient's Chart, lab work & pertinent test results  History of Anesthesia Complications Negative for: history of anesthetic complications  Airway Mallampati: III  TM Distance: >3 FB Neck ROM: full    Dental no notable dental hx.    Pulmonary shortness of breath, former smoker Pulmonary fibrosis     Pulmonary exam normal        Cardiovascular negative cardio ROS Normal cardiovascular exam     Neuro/Psych  PSYCHIATRIC DISORDERS  Depression    negative neurological ROS     GI/Hepatic negative GI ROS, Neg liver ROS,,,  Endo/Other  negative endocrine ROS    Renal/GU negative Renal ROS  negative genitourinary   Musculoskeletal   Abdominal   Peds  Hematology negative hematology ROS (+)   Anesthesia Other Findings Past Medical History: No date: Depression 06/05/2015: Personal history of tobacco use, presenting hazards to  health No date: Spondylosis     Comment:  Mild, cervical  Past Surgical History: No date: COLONOSCOPY No date: dislocated elbow 11/20/2018: TIBIA IM NAIL INSERTION; Right     Comment:  Procedure: INTRAMEDULLARY (IM) NAIL TIBIAL;  Surgeon:               Terance Hart, MD;  Location: [facility] OR;  Service:               Orthopedics;  Laterality: Right; 04/12/2018: TOTAL KNEE ARTHROPLASTY; Left     Comment:  Procedure: LEFT TOTAL KNEE ARTHROPLASTY;  Surgeon:               Gean Birchwood, MD;  Location: [facility] ORS;  Service:               Orthopedics;  Laterality: Left;     Reproductive/Obstetrics negative OB ROS                             Anesthesia Physical Anesthesia Plan  ASA: 3  Anesthesia Plan: General   Post-op Pain Management: Minimal or no pain anticipated   Induction: Intravenous  PONV Risk Score and Plan: 1 and Propofol  infusion and TIVA  Airway Management Planned: Natural Airway and Nasal Cannula  Additional Equipment:   Intra-op Plan:   Post-operative Plan:   Informed Consent: I have reviewed the patients History and Physical, chart, labs and discussed the procedure including the risks, benefits and alternatives for the proposed anesthesia with the patient or authorized representative who has indicated his/her understanding and acceptance.     Dental Advisory Given  Plan Discussed with: Anesthesiologist, CRNA and Surgeon  Anesthesia Plan Comments: (Patient consented for risks of anesthesia including but not limited to:  - adverse reactions to medications - risk of airway placement if required - damage to eyes, teeth, lips or other oral mucosa - nerve damage due to positioning  - sore throat or hoarseness - Damage to heart, brain, nerves, lungs, other parts of body or loss of life  Patient voiced understanding and assent.)       Anesthesia Quick Evaluation

## 2023-11-06 NOTE — Interval H&P Note (Signed)
 History and Physical Interval Note:  11/06/2023 9:00 AM  Anthony Shelton  has presented today for surgery, with the diagnosis of h/o TA polyps of colon.  The various methods of treatment have been discussed with the patient and family. After consideration of risks, benefits and other options for treatment, the patient has consented to  Procedure(s): COLONOSCOPY WITH PROPOFOL (N/A) ESOPHAGOGASTRODUODENOSCOPY (EGD) (N/A) as a surgical intervention.  The patient's history has been reviewed, patient examined, no change in status, stable for surgery.  I have reviewed the patient's chart and labs.  Questions were answered to the patient's satisfaction.     Regis Bill  Ok to proceed with EGD/Colonoscopy

## 2023-11-06 NOTE — Transfer of Care (Signed)
 Immediate Anesthesia Transfer of Care Note  Patient: JAMERE STIDHAM  Procedure(s) Performed: COLONOSCOPY WITH PROPOFOL ESOPHAGOGASTRODUODENOSCOPY (EGD) POLYPECTOMY  Patient Location: Endoscopy Unit  Anesthesia Type:General  Level of Consciousness: drowsy  Airway & Oxygen Therapy: Patient Spontanous Breathing  Post-op Assessment: Report given to RN and Post -op Vital signs reviewed and stable  Post vital signs: Reviewed and stable  Last Vitals:  Vitals Value Taken Time  BP 102/66 0932  Temp 35.8 0932  Pulse 69 0932  Resp 16 0932  SpO2 98% 0932    Last Pain:  Vitals:   11/06/23 0807  TempSrc: Temporal  PainSc: 0-No pain         Complications: No notable events documented.

## 2023-11-06 NOTE — Anesthesia Postprocedure Evaluation (Signed)
 Anesthesia Post Note  Patient: Anthony Shelton  Procedure(s) Performed: COLONOSCOPY WITH PROPOFOL ESOPHAGOGASTRODUODENOSCOPY (EGD) POLYPECTOMY  Patient location during evaluation: Endoscopy Anesthesia Type: General Level of consciousness: awake and alert Pain management: pain level controlled Vital Signs Assessment: post-procedure vital signs reviewed and stable Respiratory status: spontaneous breathing, nonlabored ventilation, respiratory function stable and patient connected to nasal cannula oxygen Cardiovascular status: blood pressure returned to baseline and stable Postop Assessment: no apparent nausea or vomiting Anesthetic complications: no   No notable events documented.   Last Vitals:  Vitals:   11/06/23 0943 11/06/23 0953  BP: 103/78 110/89  Pulse: 74   Resp: 19   Temp:    SpO2: 96%     Last Pain:  Vitals:   11/06/23 0953  TempSrc:   PainSc: 0-No pain                 Louie Boston

## 2023-11-06 NOTE — Op Note (Signed)
 [facility] Patient Name: Anthony Shelton Procedure Date: 11/06/2023 8:50 AM MRN: [id number] Account #: [id number] Date of Birth: Jul 31, 1961 Admit Type: Outpatient Age: 63 Room: [facility] ENDO ROOM 3 Gender: Male Note Status: Finalized Instrument Name: Prentice Docker [id number] Procedure:             Colonoscopy Indications:           Surveillance: Personal history of adenomatous polyps                         on last colonoscopy > 5 years ago Providers:             Eather Colas MD, MD Referring MD:          No Local Md, MD (Referring MD) Medicines:             Monitored Anesthesia Care Complications:         No immediate complications. Estimated blood loss:                         Minimal. Procedure:             Pre-Anesthesia Assessment:                        - Prior to the procedure, a History and Physical was                         performed, and patient medications and allergies were                         reviewed. The patient is competent. The risks and                         benefits of the procedure and the sedation options and                         risks were discussed with the patient. All questions                         were answered and informed consent was obtained.                         Patient identification and proposed procedure were                         verified by the physician, the nurse, the                         anesthesiologist, the anesthetist and the technician                         in the endoscopy suite. Mental Status Examination:                         alert and oriented. Airway Examination: normal                         oropharyngeal airway and neck mobility. Respiratory  Examination: clear to auscultation. CV Examination:                         normal. Prophylactic Antibiotics: The patient does not                         require prophylactic antibiotics. Prior                          Anticoagulants: The patient has taken no anticoagulant                         or antiplatelet agents. ASA Grade Assessment: III - A                         patient with severe systemic disease. After reviewing                         the risks and benefits, the patient was deemed in                         satisfactory condition to undergo the procedure. The                         anesthesia plan was to use monitored anesthesia care                         (MAC). Immediately prior to administration of                         medications, the patient was re-assessed for adequacy                         to receive sedatives. The heart rate, respiratory                         rate, oxygen saturations, blood pressure, adequacy of                         pulmonary ventilation, and response to care were                         monitored throughout the procedure. The physical                         status of the patient was re-assessed after the                         procedure.                        After obtaining informed consent, the colonoscope was                         passed under direct vision. Throughout the procedure,                         the patient's blood pressure, pulse, and oxygen  saturations were monitored continuously. The                         Colonoscope was introduced through the anus and                         advanced to the the cecum, identified by appendiceal                         orifice and ileocecal valve. The colonoscopy was                         performed without difficulty. The patient tolerated                         the procedure well. The quality of the bowel                         preparation was good. The ileocecal valve, appendiceal                         orifice, and rectum were photographed. Findings:      The perianal and digital rectal examinations were normal.      A 3 mm polyp was found in the proximal  transverse colon. The polyp was       sessile. The polyp was removed with a cold snare. Resection and       retrieval were complete. Estimated blood loss was minimal.      A 2 mm polyp was found in the descending colon. The polyp was sessile.       The polyp was removed with a cold snare. Resection and retrieval were       complete. Estimated blood loss was minimal.      Internal hemorrhoids were found during retroflexion. The hemorrhoids       were Grade I (internal hemorrhoids that do not prolapse).      The exam was otherwise without abnormality on direct and retroflexion       views. Impression:            - One 3 mm polyp in the proximal transverse colon,                         removed with a cold snare. Resected and retrieved.                        - One 2 mm polyp in the descending colon, removed with                         a cold snare. Resected and retrieved.                        - Internal hemorrhoids.                        - The examination was otherwise normal on direct and                         retroflexion views. Recommendation:        - Discharge patient to home.                        -  Resume previous diet.                        - Continue present medications.                        - Await pathology results.                        - Repeat colonoscopy in 5 years for surveillance.                        - Return to referring physician as previously                         scheduled. Procedure Code(s):     --- Professional ---                        [phone], Colonoscopy, flexible; with removal of                         tumor(s), polyp(s), or other lesion(s) by snare                         technique Diagnosis Code(s):     --- Professional ---                        Z86.010, Personal history of colonic polyps                        D12.3, Benign neoplasm of transverse colon (hepatic                         flexure or splenic flexure)                         D12.4, Benign neoplasm of descending colon                        K64.0, First degree hemorrhoids CPT copyright 2022 American Medical Association. All rights reserved. The codes documented in this report are preliminary and upon coder review may  be revised to meet current compliance requirements. Eather Colas MD, MD 11/06/2023 9:42:02 AM Number of Addenda: 0 Note Initiated On: 11/06/2023 8:50 AM Scope Withdrawal Time: 0 hours 8 minutes 34 seconds  Total Procedure Duration: 0 hours 12 minutes 49 seconds  Estimated Blood Loss:  Estimated blood loss was minimal.      [facility]

## 2023-11-09 LAB — SURGICAL PATHOLOGY

## 2024-02-24 ENCOUNTER — Other Ambulatory Visit: Payer: Self-pay | Admitting: *Deleted

## 2024-02-24 DIAGNOSIS — Z122 Encounter for screening for malignant neoplasm of respiratory organs: Secondary | ICD-10-CM

## 2024-02-24 DIAGNOSIS — Z87891 Personal history of nicotine dependence: Secondary | ICD-10-CM

## 2024-09-27 ENCOUNTER — Encounter: Payer: Self-pay | Admitting: Acute Care
